# Patient Record
Sex: Female | Born: 1995 | Race: White | Hispanic: No | Marital: Single | Smoking: Former smoker
Health system: Southern US, Community
[De-identification: ages and names within clinical notes are randomized; demographics above are authoritative.]

## PROBLEM LIST (undated history)

## (undated) DIAGNOSIS — Z789 Other specified health status: Secondary | ICD-10-CM

## (undated) HISTORY — PX: OTHER SURGICAL HISTORY: SHX169

---
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
## 2019-03-25 ENCOUNTER — Encounter: Payer: Self-pay | Admitting: Obstetrics and Gynecology

## 2019-03-25 ENCOUNTER — Other Ambulatory Visit: Payer: Self-pay

## 2019-03-25 ENCOUNTER — Other Ambulatory Visit (HOSPITAL_COMMUNITY)
Admission: RE | Admit: 2019-03-25 | Discharge: 2019-03-25 | Disposition: A | Discharge status: Home / Self Care | Payer: 59 | Source: Ambulatory Visit | Attending: Obstetrics and Gynecology | Admitting: Obstetrics and Gynecology

## 2019-03-25 ENCOUNTER — Ambulatory Visit (INDEPENDENT_AMBULATORY_CARE_PROVIDER_SITE_OTHER): Payer: 59 | Admitting: Obstetrics and Gynecology

## 2019-03-25 VITALS — BP 104/73 | Wt 118.0 lb

## 2019-03-25 DIAGNOSIS — Z3201 Encounter for pregnancy test, result positive: Secondary | ICD-10-CM | POA: Diagnosis not present

## 2019-03-25 DIAGNOSIS — Z124 Encounter for screening for malignant neoplasm of cervix: Secondary | ICD-10-CM | POA: Diagnosis present

## 2019-03-25 DIAGNOSIS — Z3A01 Less than 8 weeks gestation of pregnancy: Secondary | ICD-10-CM

## 2019-03-25 DIAGNOSIS — Z113 Encounter for screening for infections with a predominantly sexual mode of transmission: Secondary | ICD-10-CM

## 2019-03-25 DIAGNOSIS — Z3400 Encounter for supervision of normal first pregnancy, unspecified trimester: Secondary | ICD-10-CM | POA: Insufficient documentation

## 2019-03-25 DIAGNOSIS — N912 Amenorrhea, unspecified: Secondary | ICD-10-CM

## 2019-03-25 DIAGNOSIS — Z3689 Encounter for other specified antenatal screening: Secondary | ICD-10-CM

## 2019-03-25 DIAGNOSIS — Z3401 Encounter for supervision of normal first pregnancy, first trimester: Secondary | ICD-10-CM

## 2019-03-25 LAB — POCT URINE PREGNANCY: Preg Test, Ur: POSITIVE — AB

## 2019-03-25 NOTE — Progress Notes (Signed)
NOB Discuss hernia and effects

## 2019-03-25 NOTE — Progress Notes (Signed)
New Obstetric Patient H&P    Chief Complaint: "Desires prenatal care"   History of Present Illness: Patient is a 23 y.o. G1P0 Not Hispanic or Latino female, presents with amenorrhea and positive home pregnancy test. Patient's last menstrual period was 02/11/2019 (exact date). and based on her  LMP, her EDD is Estimated Date of Delivery: 11/18/19 and her EGA is 7363w0d. Cycles are regular.  No prior paps.   Since her LMP she claims she has experienced nausea, fatigue. She denies vaginal bleeding. Her past medical history is noncontributory.   Since her LMP, she admits to the use of tobacco products  no There are cats in the home in the home  no  She admits close contact with children on a regular basis  no  She has had chicken pox in the past yes She has had Tuberculosis exposures, symptoms, or previously tested positive for TB   no Current or past history of domestic violence. no  Genetic Screening/Teratology Counseling: (Includes patient, baby's father, or anyone in either family with:)   1. [facility] 1435 [facility]  no 2. Thalassemia ([place], [place], Mediterranean, or Asian background): MCV<80  no 3. Neural tube defect (meningomyelocele, spina bifida, anencephaly)  no 4. Congenital heart defect  no  5. Down syndrome  no 6. Tay-Sachs (Jewish, [place])  no 7. Canavan's Disease  no 8. Sickle cell disease or trait (African)  no  9. Hemophilia or other blood disorders  no  10. Muscular dystrophy  no  11. Cystic fibrosis  no  12. Huntington's Chorea  no  13. Mental retardation/autism  no 14. Other inherited genetic or chromosomal disorder  no 15. Maternal metabolic disorder (DM, PKU, etc)  no 16. Patient or FOB with a child with a birth defect not listed above no  16a. Patient or FOB with a birth defect themselves no 17. Recurrent pregnancy loss, or stillbirth  no  18. Any medications since LMP other than prenatal vitamins (include vitamins, supplements, OTC meds, drugs,  alcohol)  no 19. Any other genetic/environmental exposure to discuss  no  Infection History:   1. Lives with someone with TB or TB exposed  no  2. Patient or partner has history of genital herpes  no 3. Rash or viral illness since LMP  no 4. History of STI (GC, CT, HPV, syphilis, HIV)  no 5. History of recent travel :  no  Other pertinent information:  no     Review of Systems:10 point review of systems negative unless otherwise noted in HPI  Past Medical History:  No past medical history on file.  Past Surgical History:  No past surgical history on file.  Gynecologic History: Patient's last menstrual period was 02/11/2019 (exact date).  Obstetric History: G1P0  Family History:  No family history on file.  Social History:  Social History   Socioeconomic History  . Marital status: Single    Spouse name: Not on file  . Number of children: Not on file  . Years of education: Not on file  . Highest education level: Not on file  Occupational History  . Not on file  Social Needs  . Financial resource strain: Not on file  . Food insecurity    Worry: Not on file    Inability: Not on file  . Transportation needs    Medical: Not on file    Non-medical: Not on file  Tobacco Use  . Smoking status: Former Games developermoker  . Smokeless tobacco: Never  Used  Substance and Sexual Activity  . Alcohol use: Yes  . Drug use: No  . Sexual activity: Not on file  Lifestyle  . Physical activity    Days per week: Not on file    Minutes per session: Not on file  . Stress: Not on file  Relationships  . Social Herbalist on phone: Not on file    Gets together: Not on file    Attends religious service: Not on file    Active member of club or organization: Not on file    Attends meetings of clubs or organizations: Not on file    Relationship status: Not on file  . Intimate partner violence    Fear of current or ex partner: Not on file    Emotionally abused: Not on file     Physically abused: Not on file    Forced sexual activity: Not on file  Other Topics Concern  . Not on file  Social History Narrative  . Not on file    Allergies:  No Known Allergies  Medications: Prior to Admission medications   Not on File    Physical Exam Vitals: Weight 118 lb (53.5 kg), last menstrual period 02/11/2019.  General: NAD HEENT: normocephalic, anicteric Thyroid: no enlargement, no palpable nodules Pulmonary: No increased work of breathing, CTAB Cardiovascular: RRR, distal pulses 2+ Abdomen: NABS, soft, non-tender, non-distended.  Umbilicus without lesions.  No hepatomegaly, splenomegaly or masses palpable. No evidence of hernia  Genitourinary:  External: Normal external female genitalia.  Normal urethral meatus, normal  Bartholin's and Skene's glands.    Vagina: Normal vaginal mucosa, no evidence of prolapse.    Cervix: Grossly normal in appearance, no bleeding  Uterus:  Non-enlarged, mobile, normal contour.  No CMT  Adnexa: ovaries non-enlarged, no adnexal masses  Rectal: deferred Extremities: no edema, erythema, or tenderness Neurologic: Grossly intact Psychiatric: mood appropriate, affect full   Assessment: 23 y.o. G1P0 at [redacted]w[redacted]d presenting to initiate prenatal care  Plan: 1) Avoid alcoholic beverages. 2) Patient encouraged not to smoke.  3) Discontinue the use of all non-medicinal drugs and chemicals.  4) Take prenatal vitamins daily.  5) Nutrition, food safety (fish, cheese advisories, and high nitrite foods) and exercise discussed. 6) Hospital and practice style discussed with cross coverage system.  7) Genetic Screening, such as with 1st Trimester Screening, cell free fetal DNA, AFP testing, and Ultrasound, as well as with amniocentesis and CVS as appropriate, is discussed with patient. At the conclusion of today's visit patient declined genetic testing   Malachy Mood, MD, [place], [place] 03/25/2019, 11:32 AM

## 2019-03-27 LAB — URINE CULTURE: Organism ID, Bacteria: NO GROWTH

## 2019-03-30 LAB — CYTOLOGY - PAP
Chlamydia: NEGATIVE
Neisseria Gonorrhea: NEGATIVE

## 2019-04-07 ENCOUNTER — Other Ambulatory Visit: Payer: 59

## 2019-04-07 ENCOUNTER — Encounter: Payer: 59 | Admitting: Obstetrics and Gynecology

## 2019-04-10 ENCOUNTER — Ambulatory Visit (INDEPENDENT_AMBULATORY_CARE_PROVIDER_SITE_OTHER): Payer: 59 | Admitting: Maternal Newborn

## 2019-04-10 ENCOUNTER — Encounter: Payer: Self-pay | Admitting: Maternal Newborn

## 2019-04-10 ENCOUNTER — Ambulatory Visit (INDEPENDENT_AMBULATORY_CARE_PROVIDER_SITE_OTHER): Payer: 59

## 2019-04-10 ENCOUNTER — Other Ambulatory Visit: Payer: Self-pay | Admitting: Obstetrics and Gynecology

## 2019-04-10 ENCOUNTER — Other Ambulatory Visit: Payer: Self-pay

## 2019-04-10 VITALS — BP 108/70 | Wt 116.2 lb

## 2019-04-10 DIAGNOSIS — N8312 Corpus luteum cyst of left ovary: Secondary | ICD-10-CM

## 2019-04-10 DIAGNOSIS — Z3401 Encounter for supervision of normal first pregnancy, first trimester: Secondary | ICD-10-CM

## 2019-04-10 DIAGNOSIS — Z3689 Encounter for other specified antenatal screening: Secondary | ICD-10-CM

## 2019-04-10 DIAGNOSIS — Z3A08 8 weeks gestation of pregnancy: Secondary | ICD-10-CM

## 2019-04-10 DIAGNOSIS — O3481 Maternal care for other abnormalities of pelvic organs, first trimester: Secondary | ICD-10-CM

## 2019-04-10 DIAGNOSIS — Z3400 Encounter for supervision of normal first pregnancy, unspecified trimester: Secondary | ICD-10-CM

## 2019-04-10 NOTE — Patient Instructions (Signed)
First Trimester of Pregnancy The first trimester of pregnancy is from week 1 until the end of week 13 (months 1 through 3). A week after a sperm fertilizes an egg, the egg will implant on the wall of the uterus. This embryo will begin to develop into a baby. Genes from you and your partner will form the baby. The female genes will determine whether the baby will be a boy or a girl. At 6-8 weeks, the eyes and face will be formed, and the heartbeat can be seen on ultrasound. At the end of 12 weeks, all the baby's organs will be formed. Now that you are pregnant, you will want to do everything you can to have a healthy baby. Two of the most important things are to get good prenatal care and to follow your health care provider's instructions. Prenatal care is all the medical care you receive before the baby's birth. This care will help prevent, find, and treat any problems during the pregnancy and childbirth. Body changes during your first trimester Your body goes through many changes during pregnancy. The changes vary from woman to woman.  You may gain or lose a couple of pounds at first.  You may feel sick to your stomach (nauseous) and you may throw up (vomit). If the vomiting is uncontrollable, call your health care provider.  You may tire easily.  You may develop headaches that can be relieved by medicines. All medicines should be approved by your health care provider.  You may urinate more often. Painful urination may mean you have a bladder infection.  You may develop heartburn as a result of your pregnancy.  You may develop constipation because certain hormones are causing the muscles that push stool through your intestines to slow down.  You may develop hemorrhoids or swollen veins (varicose veins).  Your breasts may begin to grow larger and become tender. Your nipples may stick out more, and the tissue that surrounds them (areola) may become darker.  Your gums may bleed and may be  sensitive to brushing and flossing.  Dark spots or blotches (chloasma, mask of pregnancy) may develop on your face. This will likely fade after the baby is born.  Your menstrual periods will stop.  You may have a loss of appetite.  You may develop cravings for certain kinds of food.  You may have changes in your emotions from day to day, such as being excited to be pregnant or being concerned that something may go wrong with the pregnancy and baby.  You may have more vivid and strange dreams.  You may have changes in your hair. These can include thickening of your hair, rapid growth, and changes in texture. Some women also have hair loss during or after pregnancy, or hair that feels dry or thin. Your hair will most likely return to normal after your baby is born. What to expect at prenatal visits During a routine prenatal visit:  You will be weighed to make sure you and the baby are growing normally.  Your blood pressure will be taken.  Your abdomen will be measured to track your baby's growth.  The fetal heartbeat will be listened to between weeks 10 and 14 of your pregnancy.  Test results from any previous visits will be discussed. Your health care provider may ask you:  How you are feeling.  If you are feeling the baby move.  If you have had any abnormal symptoms, such as leaking fluid, bleeding, severe headaches, or abdominal   cramping.  If you are using any tobacco products, including cigarettes, chewing tobacco, and electronic cigarettes.  If you have any questions. Other tests that may be performed during your first trimester include:  Blood tests to find your blood type and to check for the presence of any previous infections. The tests will also be used to check for low iron levels (anemia) and protein on red blood cells (Rh antibodies). Depending on your risk factors, or if you previously had diabetes during pregnancy, you may have tests to check for high blood sugar  that affects pregnant women (gestational diabetes).  Urine tests to check for infections, diabetes, or protein in the urine.  An ultrasound to confirm the proper growth and development of the baby.  Fetal screens for spinal cord problems (spina bifida) and Down syndrome.  HIV (human immunodeficiency virus) testing. Routine prenatal testing includes screening for HIV, unless you choose not to have this test.  You may need other tests to make sure you and the baby are doing well. Follow these instructions at home: Medicines  Follow your health care provider's instructions regarding medicine use. Specific medicines may be either safe or unsafe to take during pregnancy.  Take a prenatal vitamin that contains at least 600 micrograms (mcg) of folic acid.  If you develop constipation, try taking a stool softener if your health care provider approves. Eating and drinking   Eat a balanced diet that includes fresh fruits and vegetables, whole grains, good sources of protein such as meat, eggs, or tofu, and low-fat dairy. Your health care provider will help you determine the amount of weight gain that is right for you.  Avoid raw meat and uncooked cheese. These carry germs that can cause birth defects in the baby.  Eating four or five small meals rather than three large meals a day may help relieve nausea and vomiting. If you start to feel nauseous, eating a few soda crackers can be helpful. Drinking liquids between meals, instead of during meals, also seems to help ease nausea and vomiting.  Limit foods that are high in fat and processed sugars, such as fried and sweet foods.  To prevent constipation: ? Eat foods that are high in fiber, such as fresh fruits and vegetables, whole grains, and beans. ? Drink enough fluid to keep your urine clear or pale yellow. Activity  Exercise only as directed by your health care provider. Most women can continue their usual exercise routine during  pregnancy. Try to exercise for 30 minutes at least 5 days a week. Exercising will help you: ? Control your weight. ? Stay in shape. ? Be prepared for labor and delivery.  Experiencing pain or cramping in the lower abdomen or lower back is a good sign that you should stop exercising. Check with your health care provider before continuing with normal exercises.  Try to avoid standing for long periods of time. Move your legs often if you must stand in one place for a long time.  Avoid heavy lifting.  Wear low-heeled shoes and practice good posture.  You may continue to have sex unless your health care provider tells you not to. Relieving pain and discomfort  Wear a good support bra to relieve breast tenderness.  Take warm sitz baths to soothe any pain or discomfort caused by hemorrhoids. Use hemorrhoid cream if your health care provider approves.  Rest with your legs elevated if you have leg cramps or low back pain.  If you develop varicose veins in   your legs, wear support hose. Elevate your feet for 15 minutes, 3-4 times a day. Limit salt in your diet. Prenatal care  Schedule your prenatal visits by the twelfth week of pregnancy. They are usually scheduled monthly at first, then more often in the last 2 months before delivery.  Write down your questions. Take them to your prenatal visits.  Keep all your prenatal visits as told by your health care provider. This is important. Safety  Wear your seat belt at all times when driving.  Make a list of emergency phone numbers, including numbers for family, friends, the hospital, and police and fire departments. General instructions  Ask your health care provider for a referral to a local prenatal education class. Begin classes no later than the beginning of month 6 of your pregnancy.  Ask for help if you have counseling or nutritional needs during pregnancy. Your health care provider can offer advice or refer you to specialists for help  with various needs.  Do not use hot tubs, steam rooms, or saunas.  Do not douche or use tampons or scented sanitary pads.  Do not cross your legs for long periods of time.  Avoid cat litter boxes and soil used by cats. These carry germs that can cause birth defects in the baby and possibly loss of the fetus by miscarriage or stillbirth.  Avoid all smoking, herbs, alcohol, and medicines not prescribed by your health care provider. Chemicals in these products affect the formation and growth of the baby.  Do not use any products that contain nicotine or tobacco, such as cigarettes and e-cigarettes. If you need help quitting, ask your health care provider. You may receive counseling support and other resources to help you quit.  Schedule a dentist appointment. At home, brush your teeth with a soft toothbrush and be gentle when you floss. Contact a health care provider if:  You have dizziness.  You have mild pelvic cramps, pelvic pressure, or nagging pain in the abdominal area.  You have persistent nausea, vomiting, or diarrhea.  You have a bad smelling vaginal discharge.  You have pain when you urinate.  You notice increased swelling in your face, hands, legs, or ankles.  You are exposed to fifth disease or chickenpox.  You are exposed to German measles (rubella) and have never had it. Get help right away if:  You have a fever.  You are leaking fluid from your vagina.  You have spotting or bleeding from your vagina.  You have severe abdominal cramping or pain.  You have rapid weight gain or loss.  You vomit blood or material that looks like coffee grounds.  You develop a severe headache.  You have shortness of breath.  You have any kind of trauma, such as from a fall or a car accident. Summary  The first trimester of pregnancy is from week 1 until the end of week 13 (months 1 through 3).  Your body goes through many changes during pregnancy. The changes vary from  woman to woman.  You will have routine prenatal visits. During those visits, your health care provider will examine you, discuss any test results you may have, and talk with you about how you are feeling. This information is not intended to replace advice given to you by your health care provider. Make sure you discuss any questions you have with your health care provider. Document Released: 08/07/2001 Document Revised: 07/26/2017 Document Reviewed: 07/25/2016 Elsevier Patient Education  2020 Elsevier Inc.  

## 2019-04-10 NOTE — Progress Notes (Signed)
    Routine Prenatal Care Visit  Subjective  Sonya Shannon is a 23 y.o. G1P0000 at [redacted]w[redacted]d being seen today for ongoing prenatal care.  She is currently monitored for the following issues for this low-risk pregnancy and has Epigastric hernia and Supervision of normal first pregnancy, antepartum on their problem list.  ----------------------------------------------------------------------------------- Patient reports no complaints.   Vag. Bleeding: None.  ----------------------------------------------------------------------------------- The following portions of the patient's history were reviewed and updated as appropriate: allergies, current medications, past family history, past medical history, past social history, past surgical history and problem list. Problem list updated.   Objective  Blood pressure 108/70, weight 116 lb 3.2 oz (52.7 kg), last menstrual period 02/11/2019. Pregravid weight 116 lb (52.6 kg) Total Weight Gain 3.2 oz (0.091 kg)  Fetal Status: Fetal Heart Rate (bpm): 175 ([place])         General:  Alert, oriented and cooperative. Patient is in no acute distress.  Skin: Skin is warm and dry. No rash noted.   Cardiovascular: Normal heart rate noted  Respiratory: Normal respiratory effort, no problems with respiration noted  Abdomen: Soft, gravid, appropriate for gestational age. Pain/Pressure: Absent     Pelvic:  Cervical exam deferred        Extremities: Normal range of motion.     Mental Status: Normal mood and affect. Normal behavior. Normal judgment and thought content.     Assessment   23 y.o. G1P0000 at [redacted]w[redacted]d, EDD 11/18/2019 by Last Menstrual Period presenting for a routine prenatal visit.  Plan   Pregnancy#1 Problems (from 02/11/19 to present)    No problems associated with this episode.      Dating scan today showed a singleton IUP at 8w 4d, consistent with LMP dating. Results reviewed with patient.  NOB labs today.  Please refer to After Visit Summary  for other counseling recommendations.   Return in about 4 weeks (around 05/08/2019) for [facility] telephone.  Avel Sensor, CNM 04/10/2019  8:38 AM

## 2019-04-10 NOTE — Progress Notes (Signed)
ROB and Dating scan- no concerns

## 2019-04-11 LAB — RPR+RH+ABO+RUB AB+AB SCR+CB...
Antibody Screen: NEGATIVE
HIV Screen 4th Generation wRfx: NONREACTIVE
Hematocrit: 35.8 % (ref 34.0–46.6)
Hemoglobin: 12.3 g/dL (ref 11.1–15.9)
Hepatitis B Surface Ag: NEGATIVE
MCH: 31.1 pg (ref 26.6–33.0)
MCHC: 34.4 g/dL (ref 31.5–35.7)
MCV: 91 fL (ref 79–97)
Platelets: 178 10*3/uL (ref 150–450)
RBC: 3.95 x10E6/uL (ref 3.77–5.28)
RDW: 12.1 % (ref 11.7–15.4)
RPR Ser Ql: NONREACTIVE
Rh Factor: POSITIVE
Rubella Antibodies, IGG: 1.42 index (ref >0.99)
Varicella zoster IgG: 947 index (ref >165)
WBC: 7 10*3/uL (ref 3.4–10.8)

## 2019-05-08 ENCOUNTER — Ambulatory Visit (INDEPENDENT_AMBULATORY_CARE_PROVIDER_SITE_OTHER): Payer: 59 | Admitting: Maternal Newborn

## 2019-05-08 ENCOUNTER — Other Ambulatory Visit: Payer: Self-pay

## 2019-05-08 ENCOUNTER — Encounter: Payer: Self-pay | Admitting: Maternal Newborn

## 2019-05-08 DIAGNOSIS — Z3A12 12 weeks gestation of pregnancy: Secondary | ICD-10-CM

## 2019-05-08 DIAGNOSIS — Z3401 Encounter for supervision of normal first pregnancy, first trimester: Secondary | ICD-10-CM

## 2019-05-08 DIAGNOSIS — Z3400 Encounter for supervision of normal first pregnancy, unspecified trimester: Secondary | ICD-10-CM

## 2019-05-08 NOTE — Patient Instructions (Signed)
First Trimester of Pregnancy The first trimester of pregnancy is from week 1 until the end of week 13 (months 1 through 3). A week after a sperm fertilizes an egg, the egg will implant on the wall of the uterus. This embryo will begin to develop into a baby. Genes from you and your partner will form the baby. The female genes will determine whether the baby will be a boy or a girl. At 6-8 weeks, the eyes and face will be formed, and the heartbeat can be seen on ultrasound. At the end of 12 weeks, all the baby's organs will be formed. Now that you are pregnant, you will want to do everything you can to have a healthy baby. Two of the most important things are to get good prenatal care and to follow your health care provider's instructions. Prenatal care is all the medical care you receive before the baby's birth. This care will help prevent, find, and treat any problems during the pregnancy and childbirth. Body changes during your first trimester Your body goes through many changes during pregnancy. The changes vary from woman to woman.  You may gain or lose a couple of pounds at first.  You may feel sick to your stomach (nauseous) and you may throw up (vomit). If the vomiting is uncontrollable, call your health care provider.  You may tire easily.  You may develop headaches that can be relieved by medicines. All medicines should be approved by your health care provider.  You may urinate more often. Painful urination may mean you have a bladder infection.  You may develop heartburn as a result of your pregnancy.  You may develop constipation because certain hormones are causing the muscles that push stool through your intestines to slow down.  You may develop hemorrhoids or swollen veins (varicose veins).  Your breasts may begin to grow larger and become tender. Your nipples may stick out more, and the tissue that surrounds them (areola) may become darker.  Your gums may bleed and may be  sensitive to brushing and flossing.  Dark spots or blotches (chloasma, mask of pregnancy) may develop on your face. This will likely fade after the baby is born.  Your menstrual periods will stop.  You may have a loss of appetite.  You may develop cravings for certain kinds of food.  You may have changes in your emotions from day to day, such as being excited to be pregnant or being concerned that something may go wrong with the pregnancy and baby.  You may have more vivid and strange dreams.  You may have changes in your hair. These can include thickening of your hair, rapid growth, and changes in texture. Some women also have hair loss during or after pregnancy, or hair that feels dry or thin. Your hair will most likely return to normal after your baby is born. What to expect at prenatal visits During a routine prenatal visit:  You will be weighed to make sure you and the baby are growing normally.  Your blood pressure will be taken.  Your abdomen will be measured to track your baby's growth.  The fetal heartbeat will be listened to between weeks 10 and 14 of your pregnancy.  Test results from any previous visits will be discussed. Your health care provider may ask you:  How you are feeling.  If you are feeling the baby move.  If you have had any abnormal symptoms, such as leaking fluid, bleeding, severe headaches, or abdominal   cramping.  If you are using any tobacco products, including cigarettes, chewing tobacco, and electronic cigarettes.  If you have any questions. Other tests that may be performed during your first trimester include:  Blood tests to find your blood type and to check for the presence of any previous infections. The tests will also be used to check for low iron levels (anemia) and protein on red blood cells (Rh antibodies). Depending on your risk factors, or if you previously had diabetes during pregnancy, you may have tests to check for high blood sugar  that affects pregnant women (gestational diabetes).  Urine tests to check for infections, diabetes, or protein in the urine.  An ultrasound to confirm the proper growth and development of the baby.  Fetal screens for spinal cord problems (spina bifida) and Down syndrome.  HIV (human immunodeficiency virus) testing. Routine prenatal testing includes screening for HIV, unless you choose not to have this test.  You may need other tests to make sure you and the baby are doing well. Follow these instructions at home: Medicines  Follow your health care provider's instructions regarding medicine use. Specific medicines may be either safe or unsafe to take during pregnancy.  Take a prenatal vitamin that contains at least 600 micrograms (mcg) of folic acid.  If you develop constipation, try taking a stool softener if your health care provider approves. Eating and drinking   Eat a balanced diet that includes fresh fruits and vegetables, whole grains, good sources of protein such as meat, eggs, or tofu, and low-fat dairy. Your health care provider will help you determine the amount of weight gain that is right for you.  Avoid raw meat and uncooked cheese. These carry germs that can cause birth defects in the baby.  Eating four or five small meals rather than three large meals a day may help relieve nausea and vomiting. If you start to feel nauseous, eating a few soda crackers can be helpful. Drinking liquids between meals, instead of during meals, also seems to help ease nausea and vomiting.  Limit foods that are high in fat and processed sugars, such as fried and sweet foods.  To prevent constipation: ? Eat foods that are high in fiber, such as fresh fruits and vegetables, whole grains, and beans. ? Drink enough fluid to keep your urine clear or pale yellow. Activity  Exercise only as directed by your health care provider. Most women can continue their usual exercise routine during  pregnancy. Try to exercise for 30 minutes at least 5 days a week. Exercising will help you: ? Control your weight. ? Stay in shape. ? Be prepared for labor and delivery.  Experiencing pain or cramping in the lower abdomen or lower back is a good sign that you should stop exercising. Check with your health care provider before continuing with normal exercises.  Try to avoid standing for long periods of time. Move your legs often if you must stand in one place for a long time.  Avoid heavy lifting.  Wear low-heeled shoes and practice good posture.  You may continue to have sex unless your health care provider tells you not to. Relieving pain and discomfort  Wear a good support bra to relieve breast tenderness.  Take warm sitz baths to soothe any pain or discomfort caused by hemorrhoids. Use hemorrhoid cream if your health care provider approves.  Rest with your legs elevated if you have leg cramps or low back pain.  If you develop varicose veins in   your legs, wear support hose. Elevate your feet for 15 minutes, 3-4 times a day. Limit salt in your diet. Prenatal care  Schedule your prenatal visits by the twelfth week of pregnancy. They are usually scheduled monthly at first, then more often in the last 2 months before delivery.  Write down your questions. Take them to your prenatal visits.  Keep all your prenatal visits as told by your health care provider. This is important. Safety  Wear your seat belt at all times when driving.  Make a list of emergency phone numbers, including numbers for family, friends, the hospital, and police and fire departments. General instructions  Ask your health care provider for a referral to a local prenatal education class. Begin classes no later than the beginning of month 6 of your pregnancy.  Ask for help if you have counseling or nutritional needs during pregnancy. Your health care provider can offer advice or refer you to specialists for help  with various needs.  Do not use hot tubs, steam rooms, or saunas.  Do not douche or use tampons or scented sanitary pads.  Do not cross your legs for long periods of time.  Avoid cat litter boxes and soil used by cats. These carry germs that can cause birth defects in the baby and possibly loss of the fetus by miscarriage or stillbirth.  Avoid all smoking, herbs, alcohol, and medicines not prescribed by your health care provider. Chemicals in these products affect the formation and growth of the baby.  Do not use any products that contain nicotine or tobacco, such as cigarettes and e-cigarettes. If you need help quitting, ask your health care provider. You may receive counseling support and other resources to help you quit.  Schedule a dentist appointment. At home, brush your teeth with a soft toothbrush and be gentle when you floss. Contact a health care provider if:  You have dizziness.  You have mild pelvic cramps, pelvic pressure, or nagging pain in the abdominal area.  You have persistent nausea, vomiting, or diarrhea.  You have a bad smelling vaginal discharge.  You have pain when you urinate.  You notice increased swelling in your face, hands, legs, or ankles.  You are exposed to fifth disease or chickenpox.  You are exposed to German measles (rubella) and have never had it. Get help right away if:  You have a fever.  You are leaking fluid from your vagina.  You have spotting or bleeding from your vagina.  You have severe abdominal cramping or pain.  You have rapid weight gain or loss.  You vomit blood or material that looks like coffee grounds.  You develop a severe headache.  You have shortness of breath.  You have any kind of trauma, such as from a fall or a car accident. Summary  The first trimester of pregnancy is from week 1 until the end of week 13 (months 1 through 3).  Your body goes through many changes during pregnancy. The changes vary from  woman to woman.  You will have routine prenatal visits. During those visits, your health care provider will examine you, discuss any test results you may have, and talk with you about how you are feeling. This information is not intended to replace advice given to you by your health care provider. Make sure you discuss any questions you have with your health care provider. Document Released: 08/07/2001 Document Revised: 07/26/2017 Document Reviewed: 07/25/2016 Elsevier Patient Education  2020 Elsevier Inc.  

## 2019-05-08 NOTE — Progress Notes (Signed)
Virtual Visit via Telephone  I connected with Sonya Shannon on 05/08/19 at  9:40 AM EDT by telephone and verified that I am speaking with the correct person using two identifiers.  Location: Patient: Home Provider: Office   The patient agreed to proceed with a televisit today.  The note below documents the prenatal telephone visit:     Subjective  Sonya Shannon is a 23 y.o. G1P0000 at [redacted]w[redacted]d being seen today for ongoing prenatal care.  She is currently monitored for the following issues for this low-risk pregnancy and has Epigastric hernia and Supervision of normal first pregnancy, antepartum on their problem list.  ----------------------------------------------------------------------------------- Patient reports occasional nausea.  She is having mild dizziness when she first gets up in the morning. Vag. Bleeding: None.  No leaking of fluid.  ----------------------------------------------------------------------------------- The following portions of the patient's history were reviewed and updated as appropriate: allergies, current medications, past family history, past medical history, past social history, past surgical history and problem list. Problem list updated.   Objective  Last menstrual period 02/11/2019. Pregravid weight 116 lb (52.6 kg) Total Weight Gain 3.2 oz (0.091 kg)  Physical exam not done as this was a telephone visit completed during COVID-19 precautions.   Assessment   23 y.o. G1P0000 at [redacted]w[redacted]d EDD 11/18/2019, by Last Menstrual Period presenting for a prenatal telephone visit.  Plan   Pregnancy#1 Problems (from 02/11/19 to present)    No problems associated with this episode.      In person visit next time for Doppler FHT. Discussed anatomy scan at 20 weeks. Advised of LSIL Pap results and plan for repeat Pap in one year.  I discussed the assessment and treatment plan with the patient. The patient was provided an opportunity to ask questions and  all were answered. The patient agreed with the plan and demonstrated an understanding of the instructions.   The patient was advised to call back or seek an in-person evaluation as needed.  I provided 4 minutes of non-face-to-face time during this encounter.  Please refer to After Visit Summary for other counseling recommendations.   Return in about 4 weeks (around 06/05/2019) for ROB.  Avel Sensor, CNM 05/08/2019  9:51 AM

## 2019-05-08 NOTE — Progress Notes (Signed)
[place] telephone visit- no concerns

## 2019-06-05 ENCOUNTER — Encounter: Payer: Self-pay | Admitting: Maternal Newborn

## 2019-06-05 ENCOUNTER — Ambulatory Visit (INDEPENDENT_AMBULATORY_CARE_PROVIDER_SITE_OTHER): Payer: 59 | Admitting: Maternal Newborn

## 2019-06-05 ENCOUNTER — Other Ambulatory Visit: Payer: Self-pay

## 2019-06-05 VITALS — BP 90/60 | Wt 126.0 lb

## 2019-06-05 DIAGNOSIS — Z3689 Encounter for other specified antenatal screening: Secondary | ICD-10-CM

## 2019-06-05 DIAGNOSIS — Z3A16 16 weeks gestation of pregnancy: Secondary | ICD-10-CM

## 2019-06-05 DIAGNOSIS — Z3402 Encounter for supervision of normal first pregnancy, second trimester: Secondary | ICD-10-CM

## 2019-06-05 DIAGNOSIS — Z3400 Encounter for supervision of normal first pregnancy, unspecified trimester: Secondary | ICD-10-CM

## 2019-06-05 LAB — POCT URINALYSIS DIPSTICK OB
Glucose, UA: NEGATIVE
POC,PROTEIN,UA: NEGATIVE

## 2019-06-05 NOTE — Progress Notes (Signed)
ROB- no concerns/denies flu shot

## 2019-06-05 NOTE — Progress Notes (Signed)
    Routine Prenatal Care Visit  Subjective  Sonya Shannon is a 23 y.o. G1P0000 at [redacted]w[redacted]d being seen today for ongoing prenatal care.  She is currently monitored for the following issues for this low-risk pregnancy and has Epigastric hernia and Supervision of normal first pregnancy, antepartum on their problem list.  ----------------------------------------------------------------------------------- Patient reports no complaints.   Vag. Bleeding: None. No leaking of fluid.  ----------------------------------------------------------------------------------- The following portions of the patient's history were reviewed and updated as appropriate: allergies, current medications, past family history, past medical history, past social history, past surgical history and problem list. Problem list updated.   Objective  Blood pressure 90/60, weight 126 lb (57.2 kg), last menstrual period 02/11/2019. Pregravid weight 116 lb (52.6 kg) Total Weight Gain 10 lb (4.536 kg) Urinalysis: Urine dipstick shows negative for glucose, protein.  Fetal Status: Fetal Heart Rate (bpm): 155         General:  Alert, oriented and cooperative. Patient is in no acute distress.  Skin: Skin is warm and dry. No rash noted.   Cardiovascular: Normal heart rate noted  Respiratory: Normal respiratory effort, no problems with respiration noted  Abdomen: Soft, gravid, appropriate for gestational age. Pain/Pressure: Present     Pelvic:  Cervical exam deferred        Extremities: Normal range of motion.  Edema: None  Mental Status: Normal mood and affect. Normal behavior. Normal judgment and thought content.     Assessment   23 y.o. G1P0000 at [redacted]w[redacted]d, EDD 11/18/2019 by Last Menstrual Period presenting for a routine prenatal visit.  Plan   Pregnancy#1 Problems (from 02/11/19 to present)    No problems associated with this episode.      Discussed anatomy scan for next time.  Please refer to After Visit Summary for  other counseling recommendations.   Return in about 4 weeks (around 07/03/2019) for ROB and anatomy scan.  Avel Sensor, CNM 06/05/2019  9:06 AM

## 2019-06-05 NOTE — Patient Instructions (Signed)

## 2019-07-03 ENCOUNTER — Ambulatory Visit: Payer: 59

## 2019-07-03 ENCOUNTER — Other Ambulatory Visit: Payer: Self-pay

## 2019-07-03 ENCOUNTER — Ambulatory Visit (INDEPENDENT_AMBULATORY_CARE_PROVIDER_SITE_OTHER): Payer: 59 | Admitting: Obstetrics and Gynecology

## 2019-07-03 ENCOUNTER — Encounter: Payer: Self-pay | Admitting: Obstetrics and Gynecology

## 2019-07-03 VITALS — BP 98/50 | Wt 133.0 lb

## 2019-07-03 DIAGNOSIS — Z3A20 20 weeks gestation of pregnancy: Secondary | ICD-10-CM

## 2019-07-03 DIAGNOSIS — Z3400 Encounter for supervision of normal first pregnancy, unspecified trimester: Secondary | ICD-10-CM

## 2019-07-03 DIAGNOSIS — Z3402 Encounter for supervision of normal first pregnancy, second trimester: Secondary | ICD-10-CM

## 2019-07-03 LAB — POCT URINALYSIS DIPSTICK OB: Glucose, UA: NEGATIVE

## 2019-07-03 NOTE — Progress Notes (Signed)
ROB No concerns Denies lof, no vb Good FM Declines flu

## 2019-07-03 NOTE — Progress Notes (Signed)
Routine Prenatal Care Visit  Subjective  Sonya Shannon is a 23 y.o. G1P0000 at [redacted]w[redacted]d being seen today for ongoing prenatal care.  She is currently monitored for the following issues for this low-risk pregnancy and has Epigastric hernia and Supervision of normal first pregnancy, antepartum on their problem list.  ----------------------------------------------------------------------------------- Patient reports no complaints.   Contractions: Not present. Vag. Bleeding: None.  Movement: Present. Denies leaking of fluid.  ----------------------------------------------------------------------------------- The following portions of the patient's history were reviewed and updated as appropriate: allergies, current medications, past family history, past medical history, past social history, past surgical history and problem list. Problem list updated.   Objective  Blood pressure (!) 98/50, weight 133 lb (60.3 kg), last menstrual period 02/11/2019. Pregravid weight 116 lb (52.6 kg) Total Weight Gain 17 lb (7.711 kg) Urinalysis:      Fetal Status: Fetal Heart Rate (bpm): 150 Fundal Height: 20 cm Movement: Present     General:  Alert, oriented and cooperative. Patient is in no acute distress.  Skin: Skin is warm and dry. No rash noted.   Cardiovascular: Normal heart rate noted  Respiratory: Normal respiratory effort, no problems with respiration noted  Abdomen: Soft, gravid, appropriate for gestational age. Pain/Pressure: Absent     Pelvic:  Cervical exam deferred        Extremities: Normal range of motion.  Edema: None  Mental Status: Normal mood and affect. Normal behavior. Normal judgment and thought content.     Assessment   23 y.o. G1P0000 at [redacted]w[redacted]d by  11/18/2019, by Last Menstrual Period presenting for routine prenatal visit  Plan   Pregnancy#1 Problems (from 02/11/19 to present)    Problem Noted Resolved   Supervision of normal first pregnancy, antepartum 03/25/2019 by  Malachy Mood, MD No   Overview Addendum 07/03/2019  9:02 AM by Homero Fellers, [place]  Dating LMP Blood type: A/Positive/-- (08/14 0846)   Genetic Screen Declines genetic testing Antibody:Negative (08/14 0846)  Anatomic [place] Patient desires to have this done at [facility], can not afford here.  Rubella: 1.42 (08/14 0846) Varicella: Immune  GTT Third trimester:  RPR: Non Reactive (08/14 0846)   Rhogam  HBsAg: Negative (08/14 0846)   TDaP vaccine                        Flu Shot: Declined HIV: Non Reactive (08/14 0846)   Baby Food Breast- shown [email or web address]        Given Book   GBS:   Contraception  Pap:03/25/2019 LGSIL  CBB     CS/VBAC    Support Person                Gestational age appropriate obstetric precautions including but not limited to vaginal bleeding, contractions, leaking of fluid and fetal movement were reviewed in detail with the patient.    Patient declining anatomy US today- she says she can not afford it and that she wants to have it done at [facility]. I told her that I do not believe that this facility performs anatomy ultrasounds, but that we would need a copy of the ultrasound report if I am mistaken. Discussed importance of anatomy US.  Discussed with Izora Gala. Patient child on a policy, likely needs pregnancy medicaid. Izora Gala will reach out to patient to discuss further.  Discussed breast feeding.   Return in about 4 weeks (around 07/31/2019) for ROB in person.  Merrin Mcvicker R  Jerene Pitch MD [facility], [facility] 07/03/2019, 9:03 AM

## 2019-07-31 ENCOUNTER — Ambulatory Visit (INDEPENDENT_AMBULATORY_CARE_PROVIDER_SITE_OTHER): Payer: 59 | Admitting: Obstetrics and Gynecology

## 2019-07-31 ENCOUNTER — Encounter: Payer: Self-pay | Admitting: Obstetrics and Gynecology

## 2019-07-31 ENCOUNTER — Other Ambulatory Visit: Payer: Self-pay

## 2019-07-31 VITALS — BP 104/64 | Wt 140.0 lb

## 2019-07-31 DIAGNOSIS — Z131 Encounter for screening for diabetes mellitus: Secondary | ICD-10-CM

## 2019-07-31 DIAGNOSIS — Z3A24 24 weeks gestation of pregnancy: Secondary | ICD-10-CM

## 2019-07-31 DIAGNOSIS — Z3402 Encounter for supervision of normal first pregnancy, second trimester: Secondary | ICD-10-CM

## 2019-07-31 DIAGNOSIS — Z3400 Encounter for supervision of normal first pregnancy, unspecified trimester: Secondary | ICD-10-CM

## 2019-07-31 DIAGNOSIS — Z113 Encounter for screening for infections with a predominantly sexual mode of transmission: Secondary | ICD-10-CM

## 2019-07-31 NOTE — Progress Notes (Signed)
  Routine Prenatal Care Visit  Subjective  Sonya Shannon is a 23 y.o. G1P0000 at [redacted]w[redacted]d being seen today for ongoing prenatal care.  She is currently monitored for the following issues for this low-risk pregnancy and has Epigastric hernia and Supervision of normal first pregnancy, antepartum on their problem list.  ----------------------------------------------------------------------------------- Patient reports no complaints.   Contractions: Not present. Vag. Bleeding: None.  Movement: Present. Leaking Fluid denies.  ----------------------------------------------------------------------------------- The following portions of the patient's history were reviewed and updated as appropriate: allergies, current medications, past family history, past medical history, past social history, past surgical history and problem list. Problem list updated.  Objective  Blood pressure 104/64, weight 140 lb (63.5 kg), last menstrual period 02/11/2019. Pregravid weight 116 lb (52.6 kg) Total Weight Gain 24 lb (10.9 kg) Urinalysis: Urine Protein    Urine Glucose    Fetal Status: Fetal Heart Rate (bpm): 145 Fundal Height: 24 cm Movement: Present     General:  Alert, oriented and cooperative. Patient is in no acute distress.  Skin: Skin is warm and dry. No rash noted.   Cardiovascular: Normal heart rate noted  Respiratory: Normal respiratory effort, no problems with respiration noted  Abdomen: Soft, gravid, appropriate for gestational age. Pain/Pressure: Absent     Pelvic:  Cervical exam deferred        Extremities: Normal range of motion.  Edema: None  Mental Status: Normal mood and affect. Normal behavior. Normal judgment and thought content.   Assessment   23 y.o. G1P0000 at [redacted]w[redacted]d by  11/18/2019, by Last Menstrual Period presenting for routine prenatal visit  Plan   Pregnancy#1 Problems (from 02/11/19 to present)    Problem Noted Resolved   Supervision of normal first pregnancy, antepartum  03/25/2019 by Malachy Mood, MD No   Overview Addendum 07/03/2019  9:02 AM by Homero Fellers, [place]  Dating LMP Blood type: A/Positive/-- (08/14 0846)   Genetic Screen Declines genetic testing Antibody:Negative (08/14 0846)  Anatomic [place] Patient desires to have this done at [facility] can not afford here.  Rubella: 1.42 (08/14 0846) Varicella: Immune  GTT Third trimester:  RPR: Non Reactive (08/14 0846)   Rhogam  HBsAg: Negative (08/14 0846)   TDaP vaccine                        Flu Shot: Declined HIV: Non Reactive (08/14 0846)   Baby Food Breast- shown [email or web address]        Given Book   GBS:   Contraception  Pap:03/25/2019 LGSIL  CBB     CS/VBAC    Support Person                Preterm labor symptoms and general obstetric precautions including but not limited to vaginal bleeding, contractions, leaking of fluid and fetal movement were reviewed in detail with the patient. Please refer to After Visit Summary for other counseling recommendations.   Return in about 3 weeks (around 08/21/2019) for 28 week labs and routine prenatal.  Prentice Docker, MD, [place] 07/31/2019 10:39 AM

## 2019-08-24 ENCOUNTER — Ambulatory Visit (INDEPENDENT_AMBULATORY_CARE_PROVIDER_SITE_OTHER): Payer: No Typology Code available for payment source | Admitting: Obstetrics and Gynecology

## 2019-08-24 ENCOUNTER — Other Ambulatory Visit: Payer: No Typology Code available for payment source

## 2019-08-24 ENCOUNTER — Other Ambulatory Visit: Payer: Self-pay

## 2019-08-24 ENCOUNTER — Encounter: Payer: Self-pay | Admitting: Obstetrics and Gynecology

## 2019-08-24 VITALS — BP 106/70 | Wt 143.0 lb

## 2019-08-24 DIAGNOSIS — Z113 Encounter for screening for infections with a predominantly sexual mode of transmission: Secondary | ICD-10-CM

## 2019-08-24 DIAGNOSIS — Z131 Encounter for screening for diabetes mellitus: Secondary | ICD-10-CM

## 2019-08-24 DIAGNOSIS — Z3A27 27 weeks gestation of pregnancy: Secondary | ICD-10-CM

## 2019-08-24 DIAGNOSIS — Z3402 Encounter for supervision of normal first pregnancy, second trimester: Secondary | ICD-10-CM

## 2019-08-24 DIAGNOSIS — Z3400 Encounter for supervision of normal first pregnancy, unspecified trimester: Secondary | ICD-10-CM

## 2019-08-24 NOTE — Progress Notes (Signed)
  Routine Prenatal Care Visit  Subjective  Sonya Shannon is a 23 y.o. G1P0000 at [redacted]w[redacted]d being seen today for ongoing prenatal care.  She is currently monitored for the following issues for this low-risk pregnancy and has Epigastric hernia and Supervision of normal first pregnancy, antepartum on their problem list.  ----------------------------------------------------------------------------------- Patient reports no complaints.   Contractions: Not present. Vag. Bleeding: None.  Movement: Present. Leaking Fluid denies.  ----------------------------------------------------------------------------------- The following portions of the patient's history were reviewed and updated as appropriate: allergies, current medications, past family history, past medical history, past social history, past surgical history and problem list. Problem list updated.  Objective  Blood pressure 106/70, weight 143 lb (64.9 kg), last menstrual period 02/11/2019. Pregravid weight 116 lb (52.6 kg) Total Weight Gain 27 lb (12.2 kg) Urinalysis: Urine Protein    Urine Glucose    Fetal Status: Fetal Heart Rate (bpm): 140 Fundal Height: 28 cm Movement: Present     General:  Alert, oriented and cooperative. Patient is in no acute distress.  Skin: Skin is warm and dry. No rash noted.   Cardiovascular: Normal heart rate noted  Respiratory: Normal respiratory effort, no problems with respiration noted  Abdomen: Soft, gravid, appropriate for gestational age. Pain/Pressure: Absent     Pelvic:  Cervical exam deferred        Extremities: Normal range of motion.  Edema: None  Mental Status: Normal mood and affect. Normal behavior. Normal judgment and thought content.   Assessment   23 y.o. G1P0000 at [redacted]w[redacted]d by  11/18/2019, by Last Menstrual Period presenting for routine prenatal visit  Plan   Pregnancy#1 Problems (from 02/11/19 to present)    Problem Noted Resolved   Supervision of normal first pregnancy, antepartum  03/25/2019 by Malachy Mood, MD No   Overview Addendum 07/03/2019  9:02 AM by Homero Fellers, [place]  Dating LMP Blood type: A/Positive/-- (08/14 0846)   Genetic Screen Declines genetic testing Antibody:Negative (08/14 0846)  Anatomic [place] Patient desires to have this done at [facility], can not afford here.  Rubella: 1.42 (08/14 0846) Varicella: Immune  GTT Third trimester:  RPR: Non Reactive (08/14 0846)   Rhogam  HBsAg: Negative (08/14 0846)   TDaP vaccine                        Flu Shot: Declined HIV: Non Reactive (08/14 0846)   Baby Food Breast- shown [email or web address]        Given Book   GBS:   Contraception  Pap:03/25/2019 LGSIL  CBB     CS/VBAC    Support Person                Preterm labor symptoms and general obstetric precautions including but not limited to vaginal bleeding, contractions, leaking of fluid and fetal movement were reviewed in detail with the patient. Please refer to After Visit Summary for other counseling recommendations.   - 1 hour gtt today w 28 week labs  Return in about 2 weeks (around 09/07/2019) for Routine Prenatal Appointment.  Prentice Docker, MD, [facility], [facility] 08/24/2019 8:30 AM

## 2019-08-25 LAB — 28 WEEK RH+PANEL
Basophils Absolute: 0 10*3/uL (ref 0.0–0.2)
Basos: 0 %
EOS (ABSOLUTE): 0.1 10*3/uL (ref 0.0–0.4)
Eos: 1 %
Gestational Diabetes Screen: 64 mg/dL — ABNORMAL LOW (ref 65–139)
HIV Screen 4th Generation wRfx: NONREACTIVE
Hematocrit: 35.6 % (ref 34.0–46.6)
Hemoglobin: 11.9 g/dL (ref 11.1–15.9)
Immature Grans (Abs): 0 10*3/uL (ref 0.0–0.1)
Immature Granulocytes: 0 %
Lymphocytes Absolute: 1.6 10*3/uL (ref 0.7–3.1)
Lymphs: 22 %
MCH: 31.2 pg (ref 26.6–33.0)
MCHC: 33.4 g/dL (ref 31.5–35.7)
MCV: 93 fL (ref 79–97)
Monocytes Absolute: 0.5 10*3/uL (ref 0.1–0.9)
Monocytes: 8 %
Neutrophils Absolute: 4.8 10*3/uL (ref 1.4–7.0)
Neutrophils: 69 %
Platelets: 225 10*3/uL (ref 150–450)
RBC: 3.82 x10E6/uL (ref 3.77–5.28)
RDW: 12 % (ref 11.7–15.4)
RPR Ser Ql: NONREACTIVE
WBC: 7 10*3/uL (ref 3.4–10.8)

## 2019-08-26 ENCOUNTER — Ambulatory Visit: Payer: Managed Care, Other (non HMO) | Attending: Internal Medicine

## 2019-08-26 DIAGNOSIS — Z20822 Contact with and (suspected) exposure to covid-19: Secondary | ICD-10-CM

## 2019-08-27 LAB — NOVEL CORONAVIRUS, NAA: SARS-CoV-2, NAA: DETECTED — AB

## 2019-08-28 NOTE — L&D Delivery Note (Signed)
Delivery Note At 4:22 AM a viable female was delivered via Vaginal, Spontaneous (Presentation: Right Occiput Anterior).  APGAR: 8, 9; weight pending .   Placenta status: Spontaneous, Intact.  Cord: 3 vessels with the following complications: None.  Cord pH: N/A  Anesthesia: Epidural Episiotomy: None Lacerations: 2nd degree Suture Repair: 3.0 monocryl Est. Blood Loss (mL): 435  Mom to postpartum.  Baby to Couplet care / Skin to Skin.  Vena Austria 11/14/2019, 5:00 AM

## 2019-09-07 ENCOUNTER — Ambulatory Visit (INDEPENDENT_AMBULATORY_CARE_PROVIDER_SITE_OTHER): Payer: No Typology Code available for payment source | Admitting: Obstetrics and Gynecology

## 2019-09-07 ENCOUNTER — Other Ambulatory Visit: Payer: Self-pay

## 2019-09-07 DIAGNOSIS — Z3400 Encounter for supervision of normal first pregnancy, unspecified trimester: Secondary | ICD-10-CM

## 2019-09-07 DIAGNOSIS — Z3A29 29 weeks gestation of pregnancy: Secondary | ICD-10-CM

## 2019-09-07 DIAGNOSIS — Z3403 Encounter for supervision of normal first pregnancy, third trimester: Secondary | ICD-10-CM

## 2019-09-07 NOTE — Progress Notes (Signed)
  I connected with Sonya Shannon on 09/07/19 at 11:10 AM EST by telephone and verified that I am speaking with the correct person using two identifiers.   I discussed the limitations, risks, security and privacy concerns of performing an evaluation and management service by telephone and the availability of in person appointments. I also discussed with the patient that there may be a patient responsible charge related to this service. The patient expressed understanding and agreed to proceed.  The patient was at home. I spoke with the patient from my workstation phoneThe names of people involved in this encounter were: Melissaann Bachtel , and Vena Austria   Routine Prenatal Care Visit  Subjective  Sonya Shannon is a 24 y.o. G1P0000 at [redacted]w[redacted]d being seen today for ongoing prenatal care.  She is currently monitored for the following issues for this low-risk pregnancy and has Epigastric hernia and Supervision of normal first pregnancy, antepartum on their problem list.  ----------------------------------------------------------------------------------- Patient reports no complaints.   Contractions: Not present. Vag. Bleeding: None.  Movement: Present. Denies leaking of fluid.  ----------------------------------------------------------------------------------- The following portions of the patient's history were reviewed and updated as appropriate: allergies, current medications, past family history, past medical history, past social history, past surgical history and problem list. Problem list updated.   Objective  Last menstrual period 02/11/2019. Pregravid weight 116 lb (52.6 kg) Total Weight Gain 27 lb (12.2 kg) Urinalysis:      Fetal Status:     Movement: Present     No physical exam as this was a remote telephone visit to promote social distancing during the current COVID-19 Pandemic   Assessment   24 y.o. G1P0000 at [redacted]w[redacted]d by  11/18/2019, by Last Menstrual Period presenting  for routine prenatal visit  Plan   Pregnancy#1 Problems (from 02/11/19 to present)    Problem Noted Resolved   Supervision of normal first pregnancy, antepartum 03/25/2019 by Vena Austria, MD No   Overview Addendum 07/03/2019  9:02 AM by Natale Milch, MD    Clinic [facility]renatal [facility]  Dating LMP Blood type: A/Positive/-- (08/14 0846)   Genetic Screen Declines genetic testing Antibody:Negative (08/14 0846)  Anatomic [place] Patient desires to have this done at [facility], can not afford here.  Rubella: 1.42 (08/14 0846) Varicella: Immune  GTT 64 RPR: Non Reactive (08/14 0846)   Rhogam N/A HBsAg: Negative (08/14 0846)   TDaP vaccine                        Flu Shot: Declined HIV: Non Reactive (08/14 0846)   Baby Food Breast- shown [email or web address]        Given Book   GBS:   Contraception  Pap:03/25/2019 LGSIL  CBB     CS/VBAC    Support Person                Gestational age appropriate obstetric precautions including but not limited to vaginal bleeding, contractions, leaking of fluid and fetal movement were reviewed in detail with the patient.    Doing well, had very mild to minimal symptoms during the course of her COVID infection.  Is cleared to come back for office visits as 12 days out form her test and symptoms resolution  Telephone time 5:55 minutes  No follow-ups on file.  Vena Austria, MD, [facility], [facility] 09/07/2019, 11:44 AM

## 2019-09-21 ENCOUNTER — Other Ambulatory Visit: Payer: Self-pay

## 2019-09-21 ENCOUNTER — Encounter: Payer: Self-pay | Admitting: Obstetrics and Gynecology

## 2019-09-21 ENCOUNTER — Ambulatory Visit (INDEPENDENT_AMBULATORY_CARE_PROVIDER_SITE_OTHER): Payer: No Typology Code available for payment source | Admitting: Obstetrics and Gynecology

## 2019-09-21 VITALS — BP 118/74 | Wt 148.0 lb

## 2019-09-21 DIAGNOSIS — Z3400 Encounter for supervision of normal first pregnancy, unspecified trimester: Secondary | ICD-10-CM

## 2019-09-21 DIAGNOSIS — Z3A31 31 weeks gestation of pregnancy: Secondary | ICD-10-CM

## 2019-09-21 DIAGNOSIS — Z3403 Encounter for supervision of normal first pregnancy, third trimester: Secondary | ICD-10-CM

## 2019-09-21 DIAGNOSIS — Z23 Encounter for immunization: Secondary | ICD-10-CM | POA: Diagnosis not present

## 2019-09-21 NOTE — Progress Notes (Signed)
  Routine Prenatal Care Visit  Subjective  Sonya Shannon is a 24 y.o. G1P0000 at [redacted]w[redacted]d being seen today for ongoing prenatal care.  She is currently monitored for the following issues for this low-risk pregnancy and has Epigastric hernia and Supervision of normal first pregnancy, antepartum on their problem list.  ----------------------------------------------------------------------------------- Patient reports no complaints.   Contractions: Not present. Vag. Bleeding: None.  Movement: Present. Leaking Fluid denies.  ----------------------------------------------------------------------------------- The following portions of the patient's history were reviewed and updated as appropriate: allergies, current medications, past family history, past medical history, past social history, past surgical history and problem list. Problem list updated.  Objective  Blood pressure 118/74, weight 148 lb (67.1 kg), last menstrual period 02/11/2019. Pregravid weight 116 lb (52.6 kg) Total Weight Gain 32 lb (14.5 kg) Urinalysis: Urine Protein    Urine Glucose    Fetal Status: Fetal Heart Rate (bpm): 155 Fundal Height: 29 cm Movement: Present     General:  Alert, oriented and cooperative. Patient is in no acute distress.  Skin: Skin is warm and dry. No rash noted.   Cardiovascular: Normal heart rate noted  Respiratory: Normal respiratory effort, no problems with respiration noted  Abdomen: Soft, gravid, appropriate for gestational age. Pain/Pressure: Absent     Pelvic:  Cervical exam deferred        Extremities: Normal range of motion.  Edema: None  Mental Status: Normal mood and affect. Normal behavior. Normal judgment and thought content.   Assessment   24 y.o. G1P0000 at [redacted]w[redacted]d by  11/18/2019, by Last Menstrual Period presenting for routine prenatal visit  Plan   Pregnancy#1 Problems (from 02/11/19 to present)    Problem Noted Resolved   Supervision of normal first pregnancy, antepartum  03/25/2019 by Vena Austria, MD No   Overview Addendum 09/07/2019 11:43 AM by Vena Austria, MD    Clinic [facility] Prenatal Labs  Dating LMP Blood type: A/Positive/-- (08/14 0846)   Genetic Screen Declines genetic testing Antibody:Negative (08/14 0846)  Anatomic [place] Patient desires to have this done at [facility], can not afford here.  Rubella: 1.42 (08/14 0846) Varicella: Immune  GTT 64 RPR: Non Reactive (08/14 0846)   Rhogam N/A HBsAg: Negative (08/14 0846)   TDaP vaccine                        Flu Shot: Declined HIV: Non Reactive (08/14 0846)   Baby Food Breast- shown [email or web address]        Given Book   GBS:   Contraception  Pap:03/25/2019 LGSIL  CBB     CS/VBAC    Support Person                Preterm labor symptoms and general obstetric precautions including but not limited to vaginal bleeding, contractions, leaking of fluid and fetal movement were reviewed in detail with the patient. Please refer to After Visit Summary for other counseling recommendations.   - TDaP today  Return in about 2 weeks (around 10/05/2019) for Routine Prenatal Appointment.  Thomasene Mohair, MD, [facility], [facility] 09/21/2019 8:35 AM

## 2019-09-21 NOTE — Addendum Note (Signed)
Addended by: Liliane Shi on: 09/21/2019 08:40 AM   Modules accepted: Orders

## 2019-10-05 ENCOUNTER — Ambulatory Visit (INDEPENDENT_AMBULATORY_CARE_PROVIDER_SITE_OTHER): Payer: No Typology Code available for payment source | Admitting: Obstetrics and Gynecology

## 2019-10-05 ENCOUNTER — Other Ambulatory Visit: Payer: Self-pay

## 2019-10-05 VITALS — BP 112/68 | Wt 151.0 lb

## 2019-10-05 DIAGNOSIS — Z3A33 33 weeks gestation of pregnancy: Secondary | ICD-10-CM

## 2019-10-05 DIAGNOSIS — Z3403 Encounter for supervision of normal first pregnancy, third trimester: Secondary | ICD-10-CM

## 2019-10-05 DIAGNOSIS — Z3400 Encounter for supervision of normal first pregnancy, unspecified trimester: Secondary | ICD-10-CM

## 2019-10-05 LAB — POCT URINALYSIS DIPSTICK OB
Glucose, UA: NEGATIVE
POC,PROTEIN,UA: NEGATIVE

## 2019-10-05 NOTE — Progress Notes (Signed)
ROB

## 2019-10-05 NOTE — Progress Notes (Signed)
    Routine Prenatal Care Visit  Subjective  Sonya Shannon is a 24 y.o. G1P0000 at [redacted]w[redacted]d being seen today for ongoing prenatal care.  She is currently monitored for the following issues for this low-risk pregnancy and has Epigastric hernia and Supervision of normal first pregnancy, antepartum on their problem list.  ----------------------------------------------------------------------------------- Patient reports no complaints.   Contractions: Not present. Vag. Bleeding: None.  Movement: Present. Denies leaking of fluid.  ----------------------------------------------------------------------------------- The following portions of the patient's history were reviewed and updated as appropriate: allergies, current medications, past family history, past medical history, past social history, past surgical history and problem list. Problem list updated.   Objective  Blood pressure 112/68, weight 151 lb (68.5 kg), last menstrual period 02/11/2019. Pregravid weight 116 lb (52.6 kg) Total Weight Gain 35 lb (15.9 kg) Urinalysis:      Fetal Status: Fetal Heart Rate (bpm): 150 Fundal Height: 32 cm Movement: Present  Presentation: Vertex  General:  Alert, oriented and cooperative. Patient is in no acute distress.  Skin: Skin is warm and dry. No rash noted.   Cardiovascular: Normal heart rate noted  Respiratory: Normal respiratory effort, no problems with respiration noted  Abdomen: Soft, gravid, appropriate for gestational age. Pain/Pressure: Absent     Pelvic:  Cervical exam deferred        Extremities: Normal range of motion.  Edema: None  ental Status: Normal mood and affect. Normal behavior. Normal judgment and thought content.     Assessment   24 y.o. G1P0000 at [redacted]w[redacted]d by  11/18/2019, by Last Menstrual Period presenting for routine prenatal visit  Plan   Pregnancy#1 Problems (from 02/11/19 to present)    Problem Noted Resolved   Supervision of normal first pregnancy, antepartum  03/25/2019 by Vena Austria, MD No   Overview Addendum 09/07/2019 11:43 AM by Vena Austria, MD    Clinic [facility] Prenatal Labs  Dating LMP Blood type: A/Positive/-- (08/14 0846)   Genetic Screen Declines genetic testing Antibody:Negative (08/14 0846)  Anatomic [place] Patient desires to have this done at [facility], can not afford here.  Rubella: 1.42 (08/14 0846) Varicella: Immune  GTT 64 RPR: Non Reactive (08/14 0846)   Rhogam N/A HBsAg: Negative (08/14 0846)   TDaP vaccine                        Flu Shot: Declined HIV: Non Reactive (08/14 0846)   Baby Food Breast- shown [email or web address]        Given Book   GBS:   Contraception  Pap:03/25/2019 LGSIL  CBB     CS/VBAC    Support Person                Gestational age appropriate obstetric precautions including but not limited to vaginal bleeding, contractions, leaking of fluid and fetal movement were reviewed in detail with the patient.    Return in about 2 weeks (around 10/19/2019) for ROB.  Vena Austria, MD, Evern Core [facility], [facility] 10/05/2019, 10:20 AM

## 2019-10-05 NOTE — Addendum Note (Signed)
Addended by: [place], Ciji Boston B on: 10/05/2019 10:28 AM   Modules accepted: Orders

## 2019-10-19 ENCOUNTER — Other Ambulatory Visit: Payer: Self-pay

## 2019-10-19 ENCOUNTER — Other Ambulatory Visit (INDEPENDENT_AMBULATORY_CARE_PROVIDER_SITE_OTHER): Payer: No Typology Code available for payment source

## 2019-10-19 ENCOUNTER — Encounter: Payer: Self-pay | Admitting: Obstetrics and Gynecology

## 2019-10-19 ENCOUNTER — Ambulatory Visit (INDEPENDENT_AMBULATORY_CARE_PROVIDER_SITE_OTHER): Payer: No Typology Code available for payment source | Admitting: Obstetrics and Gynecology

## 2019-10-19 ENCOUNTER — Other Ambulatory Visit (HOSPITAL_COMMUNITY)
Admission: RE | Admit: 2019-10-19 | Discharge: 2019-10-19 | Disposition: A | Discharge status: Home / Self Care | Payer: 59 | Source: Ambulatory Visit | Attending: Obstetrics and Gynecology | Admitting: Obstetrics and Gynecology

## 2019-10-19 VITALS — BP 110/70 | Wt 155.0 lb

## 2019-10-19 DIAGNOSIS — Z3A35 35 weeks gestation of pregnancy: Secondary | ICD-10-CM

## 2019-10-19 DIAGNOSIS — Z3400 Encounter for supervision of normal first pregnancy, unspecified trimester: Secondary | ICD-10-CM | POA: Diagnosis present

## 2019-10-19 DIAGNOSIS — O26843 Uterine size-date discrepancy, third trimester: Secondary | ICD-10-CM | POA: Diagnosis not present

## 2019-10-19 DIAGNOSIS — Z362 Encounter for other antenatal screening follow-up: Secondary | ICD-10-CM | POA: Diagnosis not present

## 2019-10-19 DIAGNOSIS — Z3403 Encounter for supervision of normal first pregnancy, third trimester: Secondary | ICD-10-CM

## 2019-10-19 LAB — POCT URINALYSIS DIPSTICK OB
Glucose, UA: NEGATIVE
POC,PROTEIN,UA: NEGATIVE

## 2019-10-19 NOTE — Progress Notes (Signed)
    Routine Prenatal Care Visit  Subjective  Sonya Shannon is a 24 y.o. G1P0000 at [redacted]w[redacted]d being seen today for ongoing prenatal care.  She is currently monitored for the following issues for this low-risk pregnancy and has Epigastric hernia and Supervision of normal first pregnancy, antepartum on their problem list.  ----------------------------------------------------------------------------------- Patient reports no complaints.   Contractions: Not present. Vag. Bleeding: None.  Movement: Present. Denies leaking of fluid.  ----------------------------------------------------------------------------------- The following portions of the patient's history were reviewed and updated as appropriate: allergies, current medications, past family history, past medical history, past social history, past surgical history and problem list. Problem list updated.   Objective  Blood pressure 110/70, weight 155 lb (70.3 kg), last menstrual period 02/11/2019. Pregravid weight 116 lb (52.6 kg) Total Weight Gain 39 lb (17.7 kg) Urinalysis:      Fetal Status: Fetal Heart Rate (bpm): 133 Fundal Height: 31 cm Movement: Present     General:  Alert, oriented and cooperative. Patient is in no acute distress.  Skin: Skin is warm and dry. No rash noted.   Cardiovascular: Normal heart rate noted  Respiratory: Normal respiratory effort, no problems with respiration noted  Abdomen: Soft, gravid, appropriate for gestational age. Pain/Pressure: Absent     Pelvic:  Cervical exam deferred Dilation: 1 Effacement (%): 50 Station: -3  Extremities: Normal range of motion.  Edema: None  Mental Status: Normal mood and affect. Normal behavior. Normal judgment and thought content.     Assessment   24 y.o. G1P0000 at [redacted]w[redacted]d by  11/18/2019, by Last Menstrual Period presenting for routine prenatal visit  Plan   Pregnancy#1 Problems (from 02/11/19 to present)    Problem Noted Resolved   Supervision of normal first  pregnancy, antepartum 03/25/2019 by Vena Austria, MD No   Overview Addendum 10/19/2019  9:13 AM by Natale Milch, MD    Clinic [facility] Prenatal Labs  Dating LMP Blood type: A/Positive/-- (08/14 0846)   Genetic Screen Declines genetic testing Antibody:Negative (08/14 0846)  Anatomic [place] Patient desires to have this done at [facility], can not afford here.  Rubella: 1.42 (08/14 0846) Varicella: Immune  GTT 64 RPR: Non Reactive (08/14 0846)   Rhogam N/A HBsAg: Negative (08/14 0846)   TDaP vaccine  09/20/2018                      Flu Shot: Declined HIV: Non Reactive (08/14 0846)   Baby Food Breast- shown [email or web address]        Given Book   GBS:   Contraception  None- have reviewed options with patient and recommendations on pregnancy spacing Pap:03/25/2019 LGSIL  CBB     CS/VBAC  NA   Support Person  Boyfriend Denyse Amass               Gestational age appropriate obstetric precautions including but not limited to vaginal bleeding, contractions, leaking of fluid and fetal movement were reviewed in detail with the patient.     Reviewed options for birth control with the patient. She is not interested in birth control at this time.  Growth [place] today for FH discrepancy  GBS/GC/CT testing today.  Discussed L&D expectations- has not taken a birthing class yet through the hospital, encouraged.   Return in about 1 week (around 10/26/2019) for ROB in person.  Natale Milch MD [facility], [facility] 10/19/2019, 9:15 AM

## 2019-10-19 NOTE — Patient Instructions (Addendum)
[email or web address]   Third Trimester of Pregnancy The third trimester is from week 28 through week 40 (months 7 through 9). The third trimester is a time when the unborn baby (fetus) is growing rapidly. At the end of the ninth month, the fetus is about 20 inches in length and weighs 6-10 pounds. Body changes during your third trimester Your body will continue to go through many changes during pregnancy. The changes vary from woman to woman. During the third trimester:  Your weight will continue to increase. You can expect to gain 25-35 pounds (11-16 kg) by the end of the pregnancy.  You may begin to get stretch marks on your hips, abdomen, and breasts.  You may urinate more often because the fetus is moving lower into your pelvis and pressing on your bladder.  You may develop or continue to have heartburn. This is caused by increased hormones that slow down muscles in the digestive tract.  You may develop or continue to have constipation because increased hormones slow digestion and cause the muscles that push waste through your intestines to relax.  You may develop hemorrhoids. These are swollen veins (varicose veins) in the rectum that can itch or be painful.  You may develop swollen, bulging veins (varicose veins) in your legs.  You may have increased body aches in the pelvis, back, or thighs. This is due to weight gain and increased hormones that are relaxing your joints.  You may have changes in your hair. These can include thickening of your hair, rapid growth, and changes in texture. Some women also have hair loss during or after pregnancy, or hair that feels dry or thin. Your hair will most likely return to normal after your baby is born.  Your breasts will continue to grow and they will continue to become tender. A yellow fluid (colostrum) may leak from your breasts. This is the first milk you are producing for your baby.  Your belly button may stick out.  You may notice more  swelling in your hands, face, or ankles.  You may have increased tingling or numbness in your hands, arms, and legs. The skin on your belly may also feel numb.  You may feel short of breath because of your expanding uterus.  You may have more problems sleeping. This can be caused by the size of your belly, increased need to urinate, and an increase in your body's metabolism.  You may notice the fetus "dropping," or moving lower in your abdomen (lightening).  You may have increased vaginal discharge.  You may notice your joints feel loose and you may have pain around your pelvic bone. What to expect at prenatal visits You will have prenatal exams every 2 weeks until week 36. Then you will have weekly prenatal exams. During a routine prenatal visit:  You will be weighed to make sure you and the baby are growing normally.  Your blood pressure will be taken.  Your abdomen will be measured to track your baby's growth.  The fetal heartbeat will be listened to.  Any test results from the previous visit will be discussed.  You may have a cervical check near your due date to see if your cervix has softened or thinned (effaced).  You will be tested for Group B streptococcus. This happens between 35 and 37 weeks. Your health care provider may ask you:  What your birth plan is.  How you are feeling.  If you are feeling the baby move.  If you have  had any abnormal symptoms, such as leaking fluid, bleeding, severe headaches, or abdominal cramping.  If you are using any tobacco products, including cigarettes, chewing tobacco, and electronic cigarettes.  If you have any questions. Other tests or screenings that may be performed during your third trimester include:  Blood tests that check for low iron levels (anemia).  Fetal testing to check the health, activity level, and growth of the fetus. Testing is done if you have certain medical conditions or if there are problems during the  pregnancy.  Nonstress test (NST). This test checks the health of your baby to make sure there are no signs of problems, such as the baby not getting enough oxygen. During this test, a belt is placed around your belly. The baby is made to move, and its heart rate is monitored during movement. What is false labor? False labor is a condition in which you feel small, irregular tightenings of the muscles in the womb (contractions) that usually go away with rest, changing position, or drinking water. These are called Braxton Hicks contractions. Contractions may last for hours, days, or even weeks before true labor sets in. If contractions come at regular intervals, become more frequent, increase in intensity, or become painful, you should see your health care provider. What are the signs of labor?  Abdominal cramps.  Regular contractions that start at 10 minutes apart and become stronger and more frequent with time.  Contractions that start on the top of the uterus and spread down to the lower abdomen and back.  Increased pelvic pressure and dull back pain.  A watery or bloody mucus discharge that comes from the vagina.  Leaking of amniotic fluid. This is also known as your "water breaking." It could be a slow trickle or a gush. Let your health care provider know if it has a color or strange odor. If you have any of these signs, call your health care provider right away, even if it is before your due date. Follow these instructions at home: Medicines  Follow your health care provider's instructions regarding medicine use. Specific medicines may be either safe or unsafe to take during pregnancy.  Take a prenatal vitamin that contains at least 600 micrograms (mcg) of folic acid.  If you develop constipation, try taking a stool softener if your health care provider approves. Eating and drinking   Eat a balanced diet that includes fresh fruits and vegetables, whole grains, good sources of protein  such as meat, eggs, or tofu, and low-fat dairy. Your health care provider will help you determine the amount of weight gain that is right for you.  Avoid raw meat and uncooked cheese. These carry germs that can cause birth defects in the baby.  If you have low calcium intake from food, talk to your health care provider about whether you should take a daily calcium supplement.  Eat four or five small meals rather than three large meals a day.  Limit foods that are high in fat and processed sugars, such as fried and sweet foods.  To prevent constipation: ? Drink enough fluid to keep your urine clear or pale yellow. ? Eat foods that are high in fiber, such as fresh fruits and vegetables, whole grains, and beans. Activity  Exercise only as directed by your health care provider. Most women can continue their usual exercise routine during pregnancy. Try to exercise for 30 minutes at least 5 days a week. Stop exercising if you experience uterine contractions.  Avoid heavy  lifting.  Do not exercise in extreme heat or humidity, or at high altitudes.  Wear low-heel, comfortable shoes.  Practice good posture.  You may continue to have sex unless your health care provider tells you otherwise. Relieving pain and discomfort  Take frequent breaks and rest with your legs elevated if you have leg cramps or low back pain.  Take warm sitz baths to soothe any pain or discomfort caused by hemorrhoids. Use hemorrhoid cream if your health care provider approves.  Wear a good support bra to prevent discomfort from breast tenderness.  If you develop varicose veins: ? Wear support pantyhose or compression stockings as told by your healthcare provider. ? Elevate your feet for 15 minutes, 3-4 times a day. Prenatal care  Write down your questions. Take them to your prenatal visits.  Keep all your prenatal visits as told by your health care provider. This is important. Safety  Wear your seat belt at  all times when driving.  Make a list of emergency phone numbers, including numbers for family, friends, the hospital, and police and fire departments. General instructions  Avoid cat litter boxes and soil used by cats. These carry germs that can cause birth defects in the baby. If you have a cat, ask someone to clean the litter box for you.  Do not travel far distances unless it is absolutely necessary and only with the approval of your health care provider.  Do not use hot tubs, steam rooms, or saunas.  Do not drink alcohol.  Do not use any products that contain nicotine or tobacco, such as cigarettes and e-cigarettes. If you need help quitting, ask your health care provider.  Do not use any medicinal herbs or unprescribed drugs. These chemicals affect the formation and growth of the baby.  Do not douche or use tampons or scented sanitary pads.  Do not cross your legs for long periods of time.  To prepare for the arrival of your baby: ? Take prenatal classes to understand, practice, and ask questions about labor and delivery. ? Make a trial run to the hospital. ? Visit the hospital and tour the maternity area. ? Arrange for maternity or paternity leave through employers. ? Arrange for family and friends to take care of pets while you are in the hospital. ? Purchase a rear-facing car seat and make sure you know how to install it in your car. ? Pack your hospital bag. ? Prepare the baby's nursery. Make sure to remove all pillows and stuffed animals from the baby's crib to prevent suffocation.  Visit your dentist if you have not gone during your pregnancy. Use a soft toothbrush to brush your teeth and be gentle when you floss. Contact a health care provider if:  You are unsure if you are in labor or if your water has broken.  You become dizzy.  You have mild pelvic cramps, pelvic pressure, or nagging pain in your abdominal area.  You have lower back pain.  You have persistent  nausea, vomiting, or diarrhea.  You have an unusual or bad smelling vaginal discharge.  You have pain when you urinate. Get help right away if:  Your water breaks before 37 weeks.  You have regular contractions less than 5 minutes apart before 37 weeks.  You have a fever.  You are leaking fluid from your vagina.  You have spotting or bleeding from your vagina.  You have severe abdominal pain or cramping.  You have rapid weight loss or weight gain.    You have shortness of breath with chest pain.  You notice sudden or extreme swelling of your face, hands, ankles, feet, or legs.  Your baby makes fewer than 10 movements in 2 hours.  You have severe headaches that do not go away when you take medicine.  You have vision changes. Summary  The third trimester is from week 28 through week 40, months 7 through 9. The third trimester is a time when the unborn baby (fetus) is growing rapidly.  During the third trimester, your discomfort may increase as you and your baby continue to gain weight. You may have abdominal, leg, and back pain, sleeping problems, and an increased need to urinate.  During the third trimester your breasts will keep growing and they will continue to become tender. A yellow fluid (colostrum) may leak from your breasts. This is the first milk you are producing for your baby.  False labor is a condition in which you feel small, irregular tightenings of the muscles in the womb (contractions) that eventually go away. These are called Braxton Hicks contractions. Contractions may last for hours, days, or even weeks before true labor sets in.  Signs of labor can include: abdominal cramps; regular contractions that start at 10 minutes apart and become stronger and more frequent with time; watery or bloody mucus discharge that comes from the vagina; increased pelvic pressure and dull back pain; and leaking of amniotic fluid. This information is not intended to replace advice  given to you by your health care provider. Make sure you discuss any questions you have with your health care provider. Document Revised: 12/04/2018 Document Reviewed: 09/18/2016 Elsevier Patient Education  2020 [employer]  Vaginal Delivery  Vaginal delivery means that you give birth by pushing your baby out of your birth canal (vagina). A team of health care providers will help you before, during, and after vaginal delivery. Birth experiences are unique for every woman and every pregnancy, and birth experiences vary depending on where you choose to give birth. What happens when I arrive at the birth center or hospital? Once you are in labor and have been admitted into the hospital or birth center, your health care provider may:  Review your pregnancy history and any concerns that you have.  Insert an IV into one of your veins. This may be used to give you fluids and medicines.  Check your blood pressure, pulse, temperature, and heart rate (vital signs).  Check whether your bag of water (amniotic sac) has broken (ruptured).  Talk with you about your birth plan and discuss pain control options. Monitoring Your health care provider may monitor your contractions (uterine monitoring) and your baby's heart rate (fetal monitoring). You may need to be monitored:  Often, but not continuously (intermittently).  All the time or for long periods at a time (continuously). Continuous monitoring may be needed if: ? You are taking certain medicines, such as medicine to relieve pain or make your contractions stronger. ? You have pregnancy or labor complications. Monitoring may be done by:  Placing a special stethoscope or a handheld monitoring device on your abdomen to check your baby's heartbeat and to check for contractions.  Placing monitors on your abdomen (external monitors) to record your baby's heartbeat and the frequency and length of contractions.  Placing monitors inside your uterus  through your vagina (internal monitors) to record your baby's heartbeat and the frequency, length, and strength of your contractions. Depending on the type of monitor, it may remain in your  or on your baby's head until birth.  Telemetry. This is a type of continuous monitoring that can be done with external or internal monitors. Instead of having to stay in bed, you are able to move around during telemetry. Physical exam Your health care provider may perform frequent physical exams. This may include:  Checking how and where your baby is positioned in your uterus.  Checking your cervix to determine: ? Whether it is thinning out (effacing). ? Whether it is opening up (dilating). What happens during labor and delivery?  Normal labor and delivery is divided into the following three stages: Stage 1  This is the longest stage of labor.  This stage can last for hours or days.  Throughout this stage, you will feel contractions. Contractions generally feel mild, infrequent, and irregular at first. They get stronger, more frequent (about every 2-3 minutes), and more regular as you move through this stage.  This stage ends when your cervix is completely dilated to 4 inches (10 cm) and completely effaced. Stage 2  This stage starts once your cervix is completely effaced and dilated and lasts until the delivery of your baby.  This stage may last from 20 minutes to 2 hours.  This is the stage where you will feel an urge to push your baby out of your vagina.  You may feel stretching and burning pain, especially when the widest part of your baby's head passes through the vaginal opening (crowning).  Once your baby is delivered, the umbilical cord will be clamped and cut. This usually occurs after waiting a period of 1-2 minutes after delivery.  Your baby will be placed on your bare chest (skin-to-skin contact) in an upright position and covered with a warm blanket. Watch your baby for feeding cues, like  rooting or sucking, and help the baby to your breast for his or her first feeding. Stage 3  This stage starts immediately after the birth of your baby and ends after you deliver the placenta.  This stage may take anywhere from 5 to 30 minutes.  After your baby has been delivered, you will feel contractions as your body expels the placenta and your uterus contracts to control bleeding. What can I expect after labor and delivery?  After labor is over, you and your baby will be monitored closely until you are ready to go home to ensure that you are both healthy. Your health care team will teach you how to care for yourself and your baby.  You and your baby will stay in the same room (rooming in) during your hospital stay. This will encourage early bonding and successful breastfeeding.  You may continue to receive fluids and medicines through an IV.  Your uterus will be checked and massaged regularly (fundal massage).  You will have some soreness and pain in your abdomen, vagina, and the area of skin between your vaginal opening and your anus (perineum).  If an incision was made near your vagina (episiotomy) or if you had some vaginal tearing during delivery, cold compresses may be placed on your episiotomy or your tear. This helps to reduce pain and swelling.  You may be given a squirt bottle to use instead of wiping when you go to the bathroom. To use the squirt bottle, follow these steps: ? Before you urinate, fill the squirt bottle with warm water. Do not use hot water. ? After you urinate, while you are sitting on the toilet, use the squirt bottle to rinse the area   around your urethra and vaginal opening. This rinses away any urine and blood. ? Fill the squirt bottle with clean water every time you use the bathroom.  It is normal to have vaginal bleeding after delivery. Wear a sanitary pad for vaginal bleeding and discharge. Summary  Vaginal delivery means that you will give birth by  pushing your baby out of your birth canal (vagina).  Your health care provider may monitor your contractions (uterine monitoring) and your baby's heart rate (fetal monitoring).  Your health care provider may perform a physical exam.  Normal labor and delivery is divided into three stages.  After labor is over, you and your baby will be monitored closely until you are ready to go home. This information is not intended to replace advice given to you by your health care provider. Make sure you discuss any questions you have with your health care provider. Document Revised: 09/17/2017 Document Reviewed: 09/17/2017 Elsevier Patient Education  2020 [employer]   Pain Relief During Labor and Delivery Many things can cause pain during labor and delivery, including:  Pressure on bones and ligaments due to the baby moving through the pelvis.  Stretching of tissues due to the baby moving through the birth canal.  Muscle tension due to anxiety or nervousness.  The uterus tightening (contracting) and relaxing to help move the baby. There are many ways to deal with the pain of labor and delivery. They include:  Taking prenatal classes. Taking these classes helps you know what to expect during your baby's birth. What you learn will increase your confidence and decrease your anxiety.  Practicing relaxation techniques or doing relaxing activities, such as: ? Focused breathing. ? Meditation. ? Visualization. ? Aroma therapy. ? Listening to your favorite music. ? Hypnosis.  Taking a warm shower or bath (hydrotherapy). This may: ? Provide comfort and relaxation. ? Lessen your perception of pain. ? Decrease the amount of pain medicine needed. ? Decrease the length of labor.  Getting a massage or counterpressure on your back.  Applying warm packs or ice packs.  Changing positions often, moving around, or using a birthing ball.  Getting: ? Pain medicine through an IV or injection into a  muscle. ? Pain medicine inserted into your spinal column. ? Injections of sterile water just under the skin on your lower back (intradermal injections). ? Laughing gas (nitrous oxide). Discuss your pain control options with your health care provider during your prenatal visits. Explore the options offered by your hospital or birth center. What kinds of medicine are available? There are two kinds of medicines that can be used to relieve pain during labor and delivery:  Analgesics. These medicines decrease pain without causing you to lose feeling or the ability to move your muscles.  Anesthetics. These medicines block feeling in the body and can decrease your ability to move freely. Both of these kinds of medicine can cause minor side effects, such as nausea, trouble concentrating, and sleepiness. They can also decrease the baby's heart rate before birth and affect the baby's breathing rate after birth. For this reason, health care providers are careful about when and how much medicine is given. What are specific medicines and procedures that provide pain relief? Local Anesthetics Local anesthetics are used to numb a small area of the body. They may be used along with another kind of anesthetic or used to numb the nerves of the vagina, cervix, and perineum during the second stage of labor. General Anesthetics General anesthetics cause you   to lose consciousness so you do not feel pain. They are usually only used for an emergency cesarean delivery. General anesthetics are given through an IV tube and a mask. Pudendal Block A pudendal block is a form of local anesthetic. It may be used to relieve the pain associated with pushing or stretching of the perineum at the time of delivery or to further numb the perineum. A pudendal block is done by injecting numbing medicine through the vaginal wall into a nerve in the pelvis. Epidural Analgesia Epidural analgesia is given through a flexible  IV catheter that is inserted into the lower back. Numbing medicine is delivered continuously to the area near your spinal column nerves (epidural space). After having this type of analgesia, you may be able to move your legs but you most likely will not be able to walk. Depending on the amount of medicine given, you may lose all feeling in the lower half of your body, or you may retain some level of sensation, including the urge to push. Epidural analgesia can be used to provide pain relief for a vaginal birth. Spinal Block A spinal block is similar to epidural analgesia, but the medicine is injected into the spinal fluid instead of the epidural space. A spinal block is only given once. It starts to relieve pain quickly, but the pain relief lasts only 1-6 hours. Spinal blocks can be used for cesarean deliveries. Combined Spinal-Epidural (CSE) Block A CSE block combines the effects of a spinal block and epidural analgesia. The spinal block works quickly to block all pain. The epidural analgesia provides continuous pain relief, even after the effects of the spinal block have worn off. This information is not intended to replace advice given to you by your health care provider. Make sure you discuss any questions you have with your health care provider. Document Revised: 07/26/2017 Document Reviewed: 01/04/2016 Elsevier Patient Education  2020 [employer].

## 2019-10-19 NOTE — Progress Notes (Signed)
ROB GBS/Aptima  No concerns Denies lof, no vb, Good FM

## 2019-10-21 LAB — CERVICOVAGINAL ANCILLARY ONLY
Chlamydia: NEGATIVE
Comment: NEGATIVE
Comment: NEGATIVE
Comment: NORMAL
Neisseria Gonorrhea: NEGATIVE
Trichomonas: NEGATIVE

## 2019-10-22 NOTE — Progress Notes (Signed)
Please call patient with normal lab result

## 2019-10-23 LAB — CULTURE, BETA STREP (GROUP B ONLY): Strep Gp B Culture: NEGATIVE

## 2019-10-27 ENCOUNTER — Ambulatory Visit (INDEPENDENT_AMBULATORY_CARE_PROVIDER_SITE_OTHER): Payer: No Typology Code available for payment source | Admitting: Advanced Practice Midwife

## 2019-10-27 ENCOUNTER — Encounter: Payer: Self-pay | Admitting: Advanced Practice Midwife

## 2019-10-27 ENCOUNTER — Other Ambulatory Visit: Payer: Self-pay

## 2019-10-27 VITALS — BP 118/74 | Wt 161.0 lb

## 2019-10-27 DIAGNOSIS — Z3A36 36 weeks gestation of pregnancy: Secondary | ICD-10-CM

## 2019-10-27 DIAGNOSIS — Z3403 Encounter for supervision of normal first pregnancy, third trimester: Secondary | ICD-10-CM

## 2019-10-27 NOTE — Progress Notes (Signed)
No vb. No lof.  

## 2019-10-27 NOTE — Progress Notes (Signed)
Routine Prenatal Care Visit  Subjective  Sonya Shannon is a 24 y.o. G1P0000 at [redacted]w[redacted]d being seen today for ongoing prenatal care.  She is currently monitored for the following issues for this low-risk pregnancy and has Epigastric hernia and Supervision of normal first pregnancy, antepartum on their problem list.  ----------------------------------------------------------------------------------- Patient reports no complaints.   Contractions: Not present. Vag. Bleeding: None.  Movement: Present. Leaking Fluid denies.  ----------------------------------------------------------------------------------- The following portions of the patient's history were reviewed and updated as appropriate: allergies, current medications, past family history, past medical history, past social history, past surgical history and problem list. Problem list updated.  Objective  Blood pressure 118/74, weight 161 lb (73 kg), last menstrual period 02/11/2019. Pregravid weight 116 lb (52.6 kg) Total Weight Gain 45 lb (20.4 kg) Urinalysis: Urine Protein    Urine Glucose    Fetal Status: Fetal Heart Rate (bpm): 150 Fundal Height: 35 cm Movement: Present     General:  Alert, oriented and cooperative. Patient is in no acute distress.  Skin: Skin is warm and dry. No rash noted.   Cardiovascular: Normal heart rate noted  Respiratory: Normal respiratory effort, no problems with respiration noted  Abdomen: Soft, gravid, appropriate for gestational age. Pain/Pressure: Absent     Pelvic:  Cervical exam deferred        Extremities: Normal range of motion.     Mental Status: Normal mood and affect. Normal behavior. Normal judgment and thought content.   The following were addressed during this visit:  Breastfeeding Education - Early initiation of breastfeeding    Comments: Keeps milk supply adequate, helps contract uterus and slow bleeding, and early milk is the perfect first food and is easy to digest.   - The  importance of exclusive breastfeeding    Comments: Provides antibodies, Lower risk of breast and ovarian cancers, and type-2 diabetes,Helps your body recover, Reduced chance of SIDS.   - Risks of giving your baby anything other than breast milk if you are breastfeeding    Comments: Make the baby less content with breastfeeds, may make my baby more susceptible to illness, and may reduce my milk supply.   - The importance of early skin-to-skin contact    Comments: Keeps baby warm and secure, helps keep baby's blood sugar up and breathing steady, easier to bond and breastfeed, and helps calm baby.  - Rooming-in on a 24-hour basis    Comments: Easier to learn baby's feeding cues, easier to bond and get to know each other, and encourages milk production.   - Feeding on demand or baby-led feeding    Comments: Helps prevent breastfeeding complications, helps bring in good milk supply, prevents under or overfeeding, and helps baby feel content and satisfied   - Frequent feeding to help assure optimal milk production    Comments: Making a full supply of milk requires frequent removal of milk from breasts, infant will eat 8-12 times in 24 hours, if separated from infant use breast massage, hand expression and/ or pumping to remove milk from breasts.   - Effective positioning and attachment    Comments: Helps my baby to get enough breast milk, helps to produce an adequate milk supply, and helps prevent nipple pain and damage   - Exclusive breastfeeding for the first 6 months    Comments: Builds a healthy milk supply and keeps it up, protects baby from sickness and disease, and breastmilk has everything your baby needs for the first 6 months.    Assessment  24 y.o. G1P0000 at [redacted]w[redacted]d by  11/18/2019, by Last Menstrual Period presenting for routine prenatal visit  Plan   Pregnancy#1 Problems (from 02/11/19 to present)    Problem Noted Resolved   Supervision of normal first pregnancy,  antepartum 03/25/2019 by Vena Austria, MD No   Overview Addendum 10/19/2019  9:13 AM by Natale Milch, MD    [facility] Prenatal Labs  Dating LMP Blood type: A/Positive/-- (08/14 0846)   Genetic Screen Declines genetic testing Antibody:Negative (08/14 0846)  Anatomic [place] Patient desires to have this done at [facility], can not afford here.  Rubella: 1.42 (08/14 0846) Varicella: Immune  GTT 64 RPR: Non Reactive (08/14 0846)   Rhogam N/A HBsAg: Negative (08/14 0846)   TDaP vaccine  09/20/2018                      Flu Shot: Declined HIV: Non Reactive (08/14 0846)   Baby Food Breast- shown [email or web address]        Given Book   GBS:   Contraception  None- have reviewed options with patient and recommendations on pregnancy spacing Pap:03/25/2019 LGSIL  CBB     CS/VBAC  NA   Support Person  Boyfriend Denyse Amass               Preterm labor symptoms and general obstetric precautions including but not limited to vaginal bleeding, contractions, leaking of fluid and fetal movement were reviewed in detail with the patient. Please refer to After Visit Summary for other counseling recommendations.   Return in about 1 week (around 11/03/2019) for rob.  Tresea Mall, CNM 10/27/2019 4:16 PM

## 2019-10-27 NOTE — Patient Instructions (Signed)

## 2019-10-27 NOTE — Lactation Note (Signed)
Lactation Consultation Note  Patient Name: Sonya Shannon Today's Date: 10/27/2019     Maternal Data    Feeding    LATCH Score                   Interventions    Lactation Tools Discussed/Used     Consult Status   Lactation student discussed benefits of breastfeeding per the Ready, Set, Baby curriculum. [name] encouraged to review breastfeeding information on Ready, set, Baby web site and given information for virtual breastfeeding classes.     Willa Rough Monterrio Gerst 10/27/2019, 4:07 PM

## 2019-11-03 ENCOUNTER — Other Ambulatory Visit: Payer: Self-pay

## 2019-11-03 ENCOUNTER — Ambulatory Visit (INDEPENDENT_AMBULATORY_CARE_PROVIDER_SITE_OTHER): Payer: 59 | Admitting: Obstetrics and Gynecology

## 2019-11-03 VITALS — BP 128/78 | Wt 162.0 lb

## 2019-11-03 DIAGNOSIS — Z3A37 37 weeks gestation of pregnancy: Secondary | ICD-10-CM

## 2019-11-03 DIAGNOSIS — Z3400 Encounter for supervision of normal first pregnancy, unspecified trimester: Secondary | ICD-10-CM

## 2019-11-03 NOTE — Progress Notes (Signed)
Obstetric H&P   Chief Complaint: ROB  Prenatal Care Provider: [facility]  History of Present Illness: 24 y.o. G1P0000 [redacted]w[redacted]d by 11/18/2019, by Last Menstrual Period presenting for routine obstetric visit today and to discuss delivery planning.  +FM, no LOF, no VB, no regular contractions.  Pregnancy has been uncomplicated other than LGSIL pap with plan to repeat in 12 months per ASCCP guidelines.     Pregravid weight 116 lb (52.6 kg) Total Weight Gain 46 lb (20.9 kg)  Pregnancy#1 Problems (from 02/11/19 to present)    Problem Noted Resolved   Supervision of normal first pregnancy, antepartum 03/25/2019 by Vena Austria, MD No   Overview Addendum 10/19/2019  9:13 AM by Natale Milch, MD    [facility] Prenatal Labs  Dating LMP Blood type: A/Positive/-- (08/14 0846)   Genetic Screen Declines genetic testing Antibody:Negative (08/14 0846)  Anatomic [place] Patient desires to have this done at [facility], can not afford here.  Rubella: 1.42 (08/14 0846) Varicella: Immune  GTT 64 RPR: Non Reactive (08/14 0846)   Rhogam N/A HBsAg: Negative (08/14 0846)   TDaP vaccine  09/20/2018                      Flu Shot: Declined HIV: Non Reactive (08/14 0846)   Baby Food Breast- shown [email or web address]        Given Book   GBS: negative  Contraception  None- have reviewed options with patient and recommendations on pregnancy spacing Pap:03/25/2019 LGSIL  CBB     CS/VBAC  NA   Support Person  Boyfriend Denyse Amass               Review of Systems: 10 point review of systems negative unless otherwise noted in HPI  Past Medical History: Patient Active Problem List   Diagnosis Date Noted  . Supervision of normal first pregnancy, antepartum 03/25/2019    [facility]inic [facility] Prenatal Labs  Dating LMP Blood type: A/Positive/-- (08/14 0846)   Genetic Screen Declines genetic testing Antibody:Negative (08/14 6295)  Anatomic [place] Patient desires to have this done at [facility], can not afford here.  Rubella:  1.42 (08/14 0846) Varicella: Immune  GTT 64 RPR: Non Reactive (08/14 0846)   Rhogam N/A HBsAg: Negative (08/14 0846)   TDaP vaccine  09/20/2018                      Flu Shot: Declined HIV: Non Reactive (08/14 0846)   Baby Food Breast- shown [email or web address]        Given Book   GBS:   Contraception  None- have reviewed options with patient and recommendations on pregnancy spacing Pap:03/25/2019 LGSIL  CBB     CS/VBAC  NA   Support Person  Boyfriend Denyse Amass        . Epigastric hernia 11/27/2012    1 cm nonreducible mass in the midline and 8 cm above the umbilicus.     Past Surgical History: Past Surgical History:  Procedure Laterality Date  . No past surgery      Past Obstetric History: # 1 - Date: None, Sex: None, Weight: None, GA: None, Delivery: None, Apgar1: None, Apgar5: None, Living: None, Birth Comments: None   Past Gynecologic History:  Family History: No family history on file.  Social History: Social History   Socioeconomic History  . Marital status: Single    Spouse name: Not on file  . Number of children: Not on file  . Years of  education: Not on file  . Highest education level: Not on file  Occupational History  . Occupation: [employer]   Tobacco Use  . Smoking status: Former Games developer  . Smokeless tobacco: Never Used  Substance and Sexual Activity  . Alcohol use: Not Currently  . Drug use: Yes    Types: Marijuana    Comment: Stoped when found out pregnant  . Sexual activity: Yes    Birth control/protection: None  Other Topics Concern  . Not on file  Social History Narrative  . Not on file   Social Determinants of Health   Financial Resource Strain:   . Difficulty of Paying Living Expenses: Not on file  Food Insecurity:   . Worried About Programme researcher, broadcasting/film/video in the Last Year: Not on file  . Ran Out of Food in the Last Year: Not on file  Transportation Needs:   . Lack of Transportation (Medical): Not on file  . Lack of Transportation  (Non-Medical): Not on file  Physical Activity:   . Days of Exercise per Week: Not on file  . Minutes of Exercise per Session: Not on file  Stress:   . Feeling of Stress : Not on file  Social Connections:   . Frequency of Communication with Friends and Family: Not on file  . Frequency of Social Gatherings with Friends and Family: Not on file  . Attends Religious Services: Not on file  . Active Member of Clubs or Organizations: Not on file  . Attends Banker Meetings: Not on file  . Marital Status: Not on file  Intimate Partner Violence:   . Fear of Current or Ex-Partner: Not on file  . Emotionally Abused: Not on file  . Physically Abused: Not on file  . Sexually Abused: Not on file    Medications: Prior to Admission medications   Not on File    Allergies: No Known Allergies  Physical Exam: Vitals: Blood pressure 128/78, weight 162 lb (73.5 kg), last menstrual period 02/11/2019.  FHT: 145  General: NAD HEENT: normocephalic, anicteric Pulmonary: No increased work of breathing Abdomen: Gravid, non-tender Leopolds: vtx Genitourinary: 1/50/-3 medium consistency, mid position Extremities: no edema, erythema, or tenderness Neurologic: Grossly intact Psychiatric: mood appropriate, affect full  Labs: No results found for this or any previous visit (from the past 24 hour(s)).  Assessment: 24 y.o. G1P0000 [redacted]w[redacted]d by 11/18/2019, by Last Menstrual Period presenting for ROB and to discuss delivery planning  Plan: 1) The ARRIVE study was a national multicenter trial that randomized 6,106 patients to induction of labor at 39 weeks 0 days to 39 weeks 4 days (3,062) compared to expectant management (3,044).  There was no significant difference in adverse perinatal outcomes but there was a significantly lower rate of cesarean delivery, as well as lower rate of maternal hypertensive complications in the induction group.  Number to treat was calculated as 28 induction of labor to  prevent 1 primary Cesarean section.  "Labor Induction versus Expectant Management in Nulliparous Low-Risk Women" The [place] Journal of Medicine iAugust 9, 2018 Vol. 379 No. 6   2) Fetus - 145 BOM  3) PNL - Blood type A/Positive/-- (08/14 0846) / Anti-bodyscreen Negative (08/14 0846) / Rubella 1.42 (08/14 0846) / Varicella Immune / RPR Non Reactive (12/28 1244) / HBsAg Negative (08/14 0846) / HIV Non Reactive (12/28 1244) / 1-hr OGTT 64 / GBS Negative/-- (02/22 1626)  4) Immunization History -  Immunization History  Administered Date(s) Administered  . Tdap 09/21/2019  Malachy Mood, MD, [facility], [facility] 11/03/2019, 12:42 PM

## 2019-11-03 NOTE — Progress Notes (Signed)
  [facility] INDUCTION ASSESSMENT Sonya Shannon Liaw May 12, 1996 Medical record #: [id number] Phone #:  Home Phone [phone]  Mobile [phone]  Home Phone [phone]    [facility]renatal Provider:[facility] Proposed admission date/time:11/13/2019 0800 Method of induction:Cytotec  Weight: Filed Weights03/09/21 1131Weight:162 lb (73.5 kg) BMI Body mass index is 24.63 kg/m. HIV Negative HSV Negative EDC Estimated Date of Delivery: 3/24/21based on:LMP  Gestational age on admission: [redacted]w[redacted]d Gravidity/parity:G1P0000  Cervix Score   0 1 2 3   Position Posterior Midposition Anterior   Consistency Firm Medium Soft   Effacement (%) 0-30 40-50 60-70 >80  Dilation (cm) Closed 1-2 3-4 >5  Baby's station -3 -2 -1 +1, +2   Bishop Score:4   Medical induction of labor  select indication(s) below Elective induction ?39 weeks multiparous patient ?39 weeks primiparous patient with Bishop score ?7 ?40 weeks primiparous patient   Medical Indications Adapted from ACOG Committee Opinion #560, "Medically Indicated Late Preterm and Early Term Deliveries," 2013.  PLACENTAL / UTERINE ISSUES FETAL ISSUES MATERNAL ISSUES  ? Placenta previa (36.0-37.6) ? Isoimmunization (37.0-38.6) ? Preeclampsia without severe features or gestational HTN (37.0)  ? Suspected accreta (34.0-35.6) ? Growth Restriction 10[birth year]18-1984) ? Preeclampsia with severe features (34.0)  ? Prior classical CD, uterine window, rupture (36.0-37.6) ? Isolated (38.0-39.6) ? Chronic HTN (38.0-39.6)  ? Prior myomectomy (37.0-38.6) ? Concurrent findings (34.0-37.6) ? Cholestasis (37.0)  ? Umbilical vein varix (37.0) ? Growth Restriction (Twins) ? Diabetes  ? Placental abruption (chronic) ? Di-Di Isolated (36.0-37.6) ? Pregestational, controlled (39.0)  OBSTETRIC ISSUES ? Di-Di concurrent findings (32.0-34.6) ? Pregestational, uncontrolled (37.0-39.0)  ? Postdates ? (41 weeks) ? Mo-Di isolated  (32.0-34.6) ? Pregestational, vascular compromise (37.0- 39.0)  ? PPROM (34.0) ? Multiple Gestation ? Gestational, diet controlled (40.0)  ? Hx of IUFD (39.0 weeks) ? Di-Di (38.0-38.6) ? Gestational, med controlled (39.0)  ? Polyhydramnios, mild/moderate; SDV 8-16 or AFI 25-35 (39.0) ? Mo-Di (36.0-37.6) ? Gestational, uncontrolled (38.0-39.0)  ? Oligohydramnios (36.0-37.6); MVP <2 cm  For indications not listed above, delivery recommendations from maternal-fetal medicine consultant occurred on:  Provider Signature: 11-[birth year]-1986 Scheduled by:AMS Date:11/03/2019 12:46 PM   Call [phone] to finalize the induction date/time  [phone] (07/17)

## 2019-11-03 NOTE — Progress Notes (Signed)
ROB

## 2019-11-10 ENCOUNTER — Other Ambulatory Visit: Payer: Self-pay

## 2019-11-10 ENCOUNTER — Ambulatory Visit (INDEPENDENT_AMBULATORY_CARE_PROVIDER_SITE_OTHER): Payer: 59 | Admitting: Certified Nurse Midwife

## 2019-11-10 VITALS — BP 110/70 | Temp 96.1°F | Wt 161.0 lb

## 2019-11-10 DIAGNOSIS — Z3400 Encounter for supervision of normal first pregnancy, unspecified trimester: Secondary | ICD-10-CM

## 2019-11-10 DIAGNOSIS — O471 False labor at or after 37 completed weeks of gestation: Secondary | ICD-10-CM

## 2019-11-10 DIAGNOSIS — Z3A38 38 weeks gestation of pregnancy: Secondary | ICD-10-CM

## 2019-11-10 LAB — POCT URINALYSIS DIPSTICK OB
Glucose, UA: NEGATIVE
POC,PROTEIN,UA: NEGATIVE

## 2019-11-10 NOTE — Progress Notes (Signed)
ROB at 38wk6d: Doing well. Having BH contractions. No vaginal bleeding or leakage of water. Baby active. Set up for an induction on Friday 3/19 at 0800  FH 34 cm/ FHT 136/ cephalic/ Cervix very posterior/ ?1/40-50%/-1/soft Breast Labor precautions  Farrel Conners, CNM

## 2019-11-13 ENCOUNTER — Other Ambulatory Visit: Payer: Self-pay

## 2019-11-13 ENCOUNTER — Inpatient Hospital Stay: Payer: No Typology Code available for payment source | Admitting: Anesthesiology

## 2019-11-13 ENCOUNTER — Inpatient Hospital Stay
Admission: RE | Admit: 2019-11-13 | Discharge: 2019-11-15 | DRG: 807 | Disposition: A | Discharge status: Home / Self Care | Payer: No Typology Code available for payment source | Attending: Obstetrics and Gynecology | Admitting: Obstetrics and Gynecology

## 2019-11-13 ENCOUNTER — Encounter: Payer: Self-pay | Admitting: Advanced Practice Midwife

## 2019-11-13 DIAGNOSIS — Z87891 Personal history of nicotine dependence: Secondary | ICD-10-CM

## 2019-11-13 DIAGNOSIS — Z3400 Encounter for supervision of normal first pregnancy, unspecified trimester: Secondary | ICD-10-CM

## 2019-11-13 DIAGNOSIS — Z3A39 39 weeks gestation of pregnancy: Secondary | ICD-10-CM | POA: Diagnosis not present

## 2019-11-13 DIAGNOSIS — O26893 Other specified pregnancy related conditions, third trimester: Secondary | ICD-10-CM | POA: Diagnosis present

## 2019-11-13 DIAGNOSIS — Z3490 Encounter for supervision of normal pregnancy, unspecified, unspecified trimester: Secondary | ICD-10-CM

## 2019-11-13 LAB — CBC
HCT: 36.7 % (ref 36.0–46.0)
Hemoglobin: 12 g/dL (ref 12.0–15.0)
MCH: 29.1 pg (ref 26.0–34.0)
MCHC: 32.7 g/dL (ref 30.0–36.0)
MCV: 89.1 fL (ref 80.0–100.0)
Platelets: 279 10*3/uL (ref 150–400)
RBC: 4.12 MIL/uL (ref 3.87–5.11)
RDW: 13.3 % (ref 11.5–15.5)
WBC: 10.1 10*3/uL (ref 4.0–10.5)
nRBC: 0 % (ref 0.0–0.2)

## 2019-11-13 LAB — TYPE AND SCREEN
ABO/RH(D): A POS
Antibody Screen: NEGATIVE

## 2019-11-13 LAB — ABO/RH: ABO/RH(D): A POS

## 2019-11-13 MED ORDER — OXYTOCIN 40 UNITS IN NORMAL SALINE INFUSION - SIMPLE MED
1.0000 m[IU]/min | INTRAVENOUS | Status: DC
  Administered 2019-11-13: 2 m[IU]/min via INTRAVENOUS
  Filled 2019-11-13: qty 1000

## 2019-11-13 MED ORDER — ONDANSETRON HCL 4 MG/2ML IJ SOLN
4.0000 mg | Freq: Four times a day (QID) | INTRAMUSCULAR | Status: DC | PRN
  Administered 2019-11-13: 4 mg via INTRAVENOUS
  Filled 2019-11-13: qty 2

## 2019-11-13 MED ORDER — MISOPROSTOL 25 MCG QUARTER TABLET
25.0000 ug | ORAL_TABLET | Freq: Once | ORAL | Status: AC
  Administered 2019-11-13: 25 ug via BUCCAL
  Filled 2019-11-13: qty 1

## 2019-11-13 MED ORDER — LIDOCAINE-EPINEPHRINE (PF) 1.5 %-1:200000 IJ SOLN
INTRAMUSCULAR | Status: DC | PRN
  Administered 2019-11-13: 3 mL via EPIDURAL

## 2019-11-13 MED ORDER — LIDOCAINE HCL (PF) 1 % IJ SOLN
30.0000 mL | INTRAMUSCULAR | Status: AC | PRN
  Administered 2019-11-13: 1 mL via SUBCUTANEOUS
  Filled 2019-11-13: qty 30

## 2019-11-13 MED ORDER — ACETAMINOPHEN 325 MG PO TABS: 650.0000 mg | ORAL_TABLET | ORAL | Status: DC | PRN

## 2019-11-13 MED ORDER — FENTANYL 2.5 MCG/ML W/ROPIVACAINE 0.15% IN NS 100 ML EPIDURAL (ARMC)
12.0000 mL/h | EPIDURAL | Status: DC
  Administered 2019-11-13: 12 mL/h via EPIDURAL
  Filled 2019-11-13: qty 100

## 2019-11-13 MED ORDER — AMMONIA AROMATIC IN INHA
RESPIRATORY_TRACT | Status: AC
  Filled 2019-11-13: qty 10

## 2019-11-13 MED ORDER — OXYTOCIN 40 UNITS IN NORMAL SALINE INFUSION - SIMPLE MED: 2.5000 [IU]/h | INTRAVENOUS | Status: DC

## 2019-11-13 MED ORDER — OXYTOCIN BOLUS FROM INFUSION
500.0000 mL | Freq: Once | INTRAVENOUS | Status: AC
  Administered 2019-11-14: 04:00:00 500 mL via INTRAVENOUS

## 2019-11-13 MED ORDER — PHENYLEPHRINE 40 MCG/ML (10ML) SYRINGE FOR IV PUSH (FOR BLOOD PRESSURE SUPPORT): 80.0000 ug | PREFILLED_SYRINGE | INTRAVENOUS | Status: DC | PRN

## 2019-11-13 MED ORDER — LACTATED RINGERS IV SOLN: 500.0000 mL | INTRAVENOUS | Status: DC | PRN

## 2019-11-13 MED ORDER — LACTATED RINGERS IV SOLN: INTRAVENOUS | Status: DC

## 2019-11-13 MED ORDER — TERBUTALINE SULFATE 1 MG/ML IJ SOLN: 0.2500 mg | Freq: Once | INTRAMUSCULAR | Status: DC | PRN

## 2019-11-13 MED ORDER — EPHEDRINE 5 MG/ML INJ: 10.0000 mg | INTRAVENOUS | Status: DC | PRN

## 2019-11-13 MED ORDER — DIPHENHYDRAMINE HCL 50 MG/ML IJ SOLN: 12.5000 mg | INTRAMUSCULAR | Status: DC | PRN

## 2019-11-13 MED ORDER — SODIUM CHLORIDE 0.9 % IV SOLN
INTRAVENOUS | Status: DC | PRN
  Administered 2019-11-13 (×2): 5 mL via EPIDURAL

## 2019-11-13 MED ORDER — MISOPROSTOL 100 MCG PO TABS
25.0000 ug | ORAL_TABLET | ORAL | Status: DC | PRN
  Administered 2019-11-13: 25 ug via VAGINAL
  Filled 2019-11-13 (×3): qty 1

## 2019-11-13 MED ORDER — OXYTOCIN 10 UNIT/ML IJ SOLN
INTRAMUSCULAR | Status: AC
  Filled 2019-11-13: qty 2

## 2019-11-13 MED ORDER — MISOPROSTOL 200 MCG PO TABS
ORAL_TABLET | ORAL | Status: AC
  Filled 2019-11-13: qty 4

## 2019-11-13 MED ORDER — LACTATED RINGERS IV SOLN: 500.0000 mL | Freq: Once | INTRAVENOUS | Status: DC

## 2019-11-13 MED ORDER — BUTORPHANOL TARTRATE 1 MG/ML IJ SOLN
1.0000 mg | INTRAMUSCULAR | Status: DC | PRN
  Administered 2019-11-13: 1 mg via INTRAVENOUS
  Filled 2019-11-13: qty 1

## 2019-11-13 NOTE — Progress Notes (Signed)
   Subjective:  Feeling pressure  Objective:   Vitals: Blood pressure 135/82, pulse 83, temperature 98.2 F (36.8 C), temperature source Oral, resp. rate 18, height 5\' 8"  (1.727 m), weight 73.9 kg, last menstrual period 02/11/2019, SpO2 100 %. General: NAD Abdomen: gravid, non-tender Cervical Exam:  Dilation: 9 Effacement (%): 80 Cervical Position: Middle Station: Plus 1 Presentation: Vertex Exam by:: Kaidin Boehle MD  FHT: 135, moderate, +accels, occasional early Toco: q2-46min  Results for orders placed or performed during the hospital encounter of 11/13/19 (from the past 24 hour(s))  CBC     Status: None   Collection Time: 11/13/19  9:09 AM  Result Value Ref Range   WBC 10.1 4.0 - 10.5 K/uL   RBC 4.12 3.87 - 5.11 MIL/uL   Hemoglobin 12.0 12.0 - 15.0 g/dL   HCT 11/15/19 40.9 - 73.5 %   MCV 89.1 80.0 - 100.0 fL   MCH 29.1 26.0 - 34.0 pg   MCHC 32.7 30.0 - 36.0 g/dL   RDW 32.9 92.4 - 26.8 %   Platelets 279 150 - 400 K/uL   nRBC 0.0 0.0 - 0.2 %  Type and screen [facility]     Status: None   Collection Time: 11/13/19  9:09 AM  Result Value Ref Range   ABO/RH(D) A POS    Antibody Screen NEG    Sample Expiration      11/16/2019,2359 Performed at [facility], [address], [place], [place]   ABO/Rh     Status: None   Collection Time: 11/13/19 10:43 AM  Result Value Ref Range   ABO/RH(D)      A POS Performed at [facility], [address] [place], [place]     Assessment:   24 y.o. G1P0000 [redacted]w[redacted]d elective IOL  Plan:   1) Labor - continue pitocin  2) Fetus - cat I tracing  [redacted]w[redacted]d, MD, Vena Austria OB/GYN, [facility] 11/13/2019, 11:35 PM

## 2019-11-13 NOTE — Progress Notes (Signed)
Pt presents to L&D for scheduled elective IOL. Pt denies leaking of fluid or vaginal bleeding and states positive fetal movement. J.Gledhill,CNM aware of pt and orders placed.

## 2019-11-13 NOTE — Progress Notes (Signed)
   Subjective:  Comfortable, feeling contractions but not painful  Objective:   Vitals: Blood pressure 111/68, pulse 77, temperature 98.5 F (36.9 C), temperature source Oral, resp. rate 18, height 5\' 8"  (1.727 m), weight 73.9 kg, last menstrual period 02/11/2019. General: NAD Abdomen: gravid, soft, non-tender Cervical Exam:  Dilation: 3 Effacement (%): 70 Cervical Position: Middle Station: -2 Presentation: Vertex Exam by:: A.Staphanie Harbison,MD AROM clear  FHT: 130, moderate, +accels, no decels Toco: q2-3min  Results for orders placed or performed during the hospital encounter of 11/13/19 (from the past 24 hour(s))  CBC     Status: None   Collection Time: 11/13/19  9:09 AM  Result Value Ref Range   WBC 10.1 4.0 - 10.5 K/uL   RBC 4.12 3.87 - 5.11 MIL/uL   Hemoglobin 12.0 12.0 - 15.0 g/dL   HCT 11/15/19 97.6 - 73.4 %   MCV 89.1 80.0 - 100.0 fL   MCH 29.1 26.0 - 34.0 pg   MCHC 32.7 30.0 - 36.0 g/dL   RDW 19.3 79.0 - 24.0 %   Platelets 279 150 - 400 K/uL   nRBC 0.0 0.0 - 0.2 %  Type and screen [facility]     Status: None   Collection Time: 11/13/19  9:09 AM  Result Value Ref Range   ABO/RH(D) A POS    Antibody Screen NEG    Sample Expiration      11/16/2019,2359 Performed at [facility], [address] [place], [place]   ABO/Rh     Status: None   Collection Time: 11/13/19 10:43 AM  Result Value Ref Range   ABO/RH(D)      A POS Performed at [facility], [address], [place], [place]     Assessment:   24 y.o. G1P0000 [redacted]w[redacted]d elective IOL  Plan:   1) Labor - continue pitocin, AROM performed clear fluid  2) Fetus - Cat I tracing  [redacted]w[redacted]d, MD, Vena Austria OB/GYN, [facility] 11/13/2019, 6:05 PM

## 2019-11-13 NOTE — Progress Notes (Signed)
  Labor Progress Note   24 y.o. G1P0000 @ [redacted]w[redacted]d , admitted for  Pregnancy, Labor Management.   Subjective:  Still comfortable- not feeling contractions  Objective:  BP 113/73 (BP Location: Right Arm)   Pulse 78   Temp 98.2 F (36.8 C) (Oral)   Resp 18   Ht 5\' 8"  (1.727 m)   Wt 73.9 kg   LMP 02/11/2019 (Exact Date)   BMI 24.78 kg/m  Abd: gravid, ND, FHT present, mild tenderness on exam Extr: no edema SVE: CERVIX: 2 cm dilated, 60 effaced, -2 station Cervical sweep  EFM: FHR: 135 bpm, variability: moderate,  accelerations:  Present,  decelerations:  Absent Toco: Frequency: Every 1-4 minutes Labs: I have reviewed the patient's lab results.   Assessment & Plan:  G1P0000 @ 106w2d, admitted for  Pregnancy and Labor/Delivery Management  1. Pain management: none. 2. FWB: FHT category I.  3. ID: GBS negative 4. Labor management: start and titrate pitocin  All discussed with patient, see orders   [redacted]w[redacted]d, CNM [facility] 11/13/2019  1:34 PM

## 2019-11-13 NOTE — Anesthesia Preprocedure Evaluation (Signed)
Anesthesia Evaluation  Patient identified by MRN, date of birth, ID band Patient awake    Reviewed: Allergy & Precautions, H&P , NPO status , Patient's Chart, lab work & pertinent test results  History of Anesthesia Complications Negative for: history of anesthetic complications  Airway Mallampati: III  TM Distance: >3 FB Neck ROM: full    Dental  (+) Chipped   Pulmonary neg pulmonary ROS, former smoker,           Cardiovascular Exercise Tolerance: Good (-) hypertensionnegative cardio ROS       Neuro/Psych    GI/Hepatic negative GI ROS,   Endo/Other    Renal/GU   negative genitourinary   Musculoskeletal   Abdominal   Peds  Hematology negative hematology ROS (+)   Anesthesia Other Findings History reviewed. No pertinent past medical history.  Past Surgical History: No date: No past surgery  BMI    Body Mass Index: 24.78 kg/m      Reproductive/Obstetrics (+) Pregnancy                             Anesthesia Physical Anesthesia Plan  ASA: II  Anesthesia Plan: Epidural   Post-op Pain Management:    Induction:   PONV Risk Score and Plan:   Airway Management Planned: Natural Airway  Additional Equipment:   Intra-op Plan:   Post-operative Plan:   Informed Consent: I have reviewed the patients History and Physical, chart, labs and discussed the procedure including the risks, benefits and alternatives for the proposed anesthesia with the patient or authorized representative who has indicated his/her understanding and acceptance.     Dental Advisory Given  Plan Discussed with: Anesthesiologist  Anesthesia Plan Comments: (Patient reports no bleeding problems and no anticoagulant use.   Patient consented for risks of anesthesia including but not limited to:  - adverse reactions to medications - risk of bleeding, infection, nerve damage and headache - risk of failed  epidural - Damage to heart, brain, lungs or loss of life  Patient voiced understanding.)        Anesthesia Quick Evaluation

## 2019-11-13 NOTE — Anesthesia Procedure Notes (Signed)
Epidural Patient location during procedure: OB Start time: 11/13/2019 8:39 PM End time: 11/13/2019 8:42 PM  Staffing Anesthesiologist: Kye Silverstein, Cleda Mccreedy, MD Performed: anesthesiologist   Preanesthetic Checklist Completed: patient identified, IV checked, site marked, risks and benefits discussed, surgical consent, monitors and equipment checked, pre-op evaluation and timeout performed  Epidural Patient position: sitting Prep: ChloraPrep Patient monitoring: heart rate, continuous pulse ox and blood pressure Approach: midline Location: L3-L4 Injection technique: LOR saline  Needle:  Needle type: Tuohy  Needle gauge: 17 G Needle length: 9 cm and 9 Needle insertion depth: 4.5 cm Catheter type: closed end flexible Catheter size: 19 Gauge Catheter at skin depth: 9.5 cm Test dose: negative and 1.5% lidocaine with Epi 1:200 K  Assessment Sensory level: T10 Events: blood not aspirated, injection not painful, no injection resistance, no paresthesia and negative IV test  Additional Notes 1 attempt Pt. Evaluated and documentation done after procedure finished. Patient identified. Risks/Benefits/Options discussed with patient including but not limited to bleeding, infection, nerve damage, paralysis, failed block, incomplete pain control, headache, blood pressure changes, nausea, vomiting, reactions to medication both or allergic, itching and postpartum back pain. Confirmed with bedside nurse the patient's most recent platelet count. Confirmed with patient that they are not currently taking any anticoagulation, have any bleeding history or any family history of bleeding disorders. Patient expressed understanding and wished to proceed. All questions were answered. Sterile technique was used throughout the entire procedure. Please see nursing notes for vital signs. Test dose was given through epidural catheter and negative prior to continuing to dose epidural or start infusion. Warning signs of  high block given to the patient including shortness of breath, tingling/numbness in hands, complete motor block, or any concerning symptoms with instructions to call for help. Patient was given instructions on fall risk and not to get out of bed. All questions and concerns addressed with instructions to call with any issues or inadequate analgesia.   Patient tolerated the insertion well without immediate complications.Reason for block:procedure for pain

## 2019-11-13 NOTE — H&P (Signed)
History and Physical Interval Note:  11/13/2019 9:55 AM  Sonya Shannon  has presented today for INDUCTION OF LABOR (cervical ripening agents),  with the diagnosis of elective induction. The various methods of treatment have been discussed with the patient and family. After consideration of risks, benefits and other options for treatment, the patient has consented to  Labor induction .  The patient's history has been reviewed, patient examined, no change in status, and is stable for induction as planned.  See H&P. I have reviewed the patient's chart and labs.  Questions were answered to the patient's satisfaction.    Vital Signs: BP 120/81 (BP Location: Left Arm)   Pulse 90   Temp 98.7 F (37.1 C) (Oral)   Resp 18   Ht 5\' 8"  (1.727 m)   Wt 73.9 kg   LMP 02/11/2019 (Exact Date)   BMI 24.78 kg/m  Constitutional: Well nourished, well developed female in no acute distress.  HEENT: normal Skin: Warm and dry.  Cardiovascular: Regular rate and rhythm.   Extremity: no edema  Respiratory: Clear to auscultation bilateral. Normal respiratory effort Abdomen: FHT present Back: no CVAT Neuro: DTRs 2+, Cranial nerves grossly intact Psych: Alert and Oriented x3. No memory deficits. Normal mood and affect.  MS: normal gait, normal bilateral lower extremity ROM/strength/stability.  Pelvic exam: (female chaperone present) is not limited by body habitus EGBUS: within normal limits Vagina: within normal limits and with normal mucosa  Cervix: 1/50/-2 First dose cytotec placed/25 mcg each vaginal and buccal  Toco: occasional contraction Fetal well being: 140 bpm baseline, moderate variability, +accelerations, -decelerations   08-07-1997, CNM [facility] 11/13/2019  9:55 AM

## 2019-11-14 ENCOUNTER — Encounter: Payer: Self-pay | Admitting: Advanced Practice Midwife

## 2019-11-14 LAB — RPR: RPR Ser Ql: NONREACTIVE

## 2019-11-14 MED ORDER — OXYCODONE-ACETAMINOPHEN 5-325 MG PO TABS: 1.0000 | ORAL_TABLET | ORAL | Status: DC | PRN

## 2019-11-14 MED ORDER — BENZOCAINE-MENTHOL 20-0.5 % EX AERO
1.0000 "application " | INHALATION_SPRAY | CUTANEOUS | Status: DC | PRN
  Administered 2019-11-14: 1 via TOPICAL
  Filled 2019-11-14: qty 56

## 2019-11-14 MED ORDER — OXYCODONE-ACETAMINOPHEN 5-325 MG PO TABS: 2.0000 | ORAL_TABLET | ORAL | Status: DC | PRN

## 2019-11-14 MED ORDER — ACETAMINOPHEN 325 MG PO TABS: 650.0000 mg | ORAL_TABLET | ORAL | Status: DC | PRN

## 2019-11-14 MED ORDER — DIPHENHYDRAMINE HCL 25 MG PO CAPS: 25.0000 mg | ORAL_CAPSULE | Freq: Four times a day (QID) | ORAL | Status: DC | PRN

## 2019-11-14 MED ORDER — DIBUCAINE (PERIANAL) 1 % EX OINT: 1.0000 "application " | TOPICAL_OINTMENT | CUTANEOUS | Status: DC | PRN

## 2019-11-14 MED ORDER — SENNOSIDES-DOCUSATE SODIUM 8.6-50 MG PO TABS
2.0000 | ORAL_TABLET | ORAL | Status: DC
  Administered 2019-11-14: 2 via ORAL
  Filled 2019-11-14: qty 2

## 2019-11-14 MED ORDER — ONDANSETRON HCL 4 MG PO TABS: 4.0000 mg | ORAL_TABLET | ORAL | Status: DC | PRN

## 2019-11-14 MED ORDER — ONDANSETRON HCL 4 MG/2ML IJ SOLN: 4.0000 mg | INTRAMUSCULAR | Status: DC | PRN

## 2019-11-14 MED ORDER — SIMETHICONE 80 MG PO CHEW: 80.0000 mg | CHEWABLE_TABLET | ORAL | Status: DC | PRN

## 2019-11-14 MED ORDER — IBUPROFEN 600 MG PO TABS
600.0000 mg | ORAL_TABLET | Freq: Four times a day (QID) | ORAL | Status: DC
  Administered 2019-11-14 – 2019-11-15 (×5): 600 mg via ORAL
  Filled 2019-11-14 (×5): qty 1

## 2019-11-14 MED ORDER — WITCH HAZEL-GLYCERIN EX PADS: 1.0000 "application " | MEDICATED_PAD | CUTANEOUS | Status: DC | PRN

## 2019-11-14 MED ORDER — PRENATAL MULTIVITAMIN CH
1.0000 | ORAL_TABLET | Freq: Every day | ORAL | Status: DC
  Administered 2019-11-14: 1 via ORAL
  Filled 2019-11-14: qty 1

## 2019-11-14 MED ORDER — COCONUT OIL OIL: 1.0000 "application " | TOPICAL_OIL | Status: DC | PRN

## 2019-11-14 NOTE — Discharge Summary (Signed)
Obstetric Discharge Summary Reason for Admission: induction of labor Prenatal Procedures: none Intrapartum Procedures: spontaneous vaginal delivery Postpartum Procedures: none Complications-Operative and Postpartum: none Hemoglobin  Date Value Ref Range Status  11/15/2019 9.5 (L) 12.0 - 15.0 g/dL Final  51/70/0174 94.4 11.1 - 15.9 g/dL Final   HCT  Date Value Ref Range Status  11/15/2019 29.1 (L) 36.0 - 46.0 % Final   Hematocrit  Date Value Ref Range Status  08/24/2019 35.6 34.0 - 46.6 % Final    Physical Exam:  General: alert, cooperative and no distress Lochia: appropriate Uterine Fundus: firm/ U-1/ML/NT Perineum: healing well, no dehiscence, no significant erythema DVT Evaluation: No evidence of DVT seen on physical exam.  Discharge Diagnoses: Term Pregnancy-delivered  Discharge Information: Date: 11/15/2019 Activity: pelvic rest Diet: routine Allergies as of 11/15/2019   No Known Allergies     Medication List    TAKE these medications   acetaminophen 325 MG tablet Commonly known as: Tylenol Take 2 tablets (650 mg total) by mouth every 4 (four) hours as needed for mild pain or moderate pain (for pain scale < 4).   ferrous sulfate 325 (65 FE) MG tablet Commonly known as: FerrouSul Take 1 tablet (325 mg total) by mouth daily.   ibuprofen 600 MG tablet Commonly known as: ADVIL Take 1 tablet (600 mg total) by mouth every 6 (six) hours as needed for moderate pain or cramping.   Prenatal Vitamins 28-0.8 MG Tabs Take 1 tablet by mouth daily.       Condition: stable Discharge to: home Follow-up Information    Vena Austria, MD Follow up in 6 week(s).   Specialty: Obstetrics and Gynecology Why: postpartum visit Contact information: [address] [place] [address] [phone]           Newborn Data: Live born female Sonya Shannon Birth Weight:  7#6.5oz APGAR: 8, 9  Newborn Delivery   Birth date/time: 11/14/2019 04:22:00 Delivery type:  Vaginal, Spontaneous      Home with mother.  Sonya Shannon 11/15/2019, 10:50 AM

## 2019-11-14 NOTE — Lactation Note (Signed)
This note was copied from a baby's chart. Lactation Consultation Note  Patient Name: Sonya Shannon [id number] Today's Date: 11/14/2019   Mom breast feeding well with minimal assistance.  Sonya Shannon latches well with strong, rhythmic suck and occasional swallow.  Mom denies any breast or nipple pain.  Demonstrated hand expression.  Mom putting Sonya Shannon to the breast whenever she demonstrates feeding cues.  Reviewed normal newborn stomach size, supply and demand, normal course of lactation and routine newborn feeding patterns.  Hand out given on community lactation resources with contact numbers and reviewed.  Lactation name and number written on white board and encouraged to call with any questions, concerns or assistance.  Maternal Data    Feeding Feeding Type: Breast Fed  LATCH Score                   Interventions    Lactation Tools Discussed/Used     Consult Status      Louis Meckel 11/14/2019, 3:01 PM

## 2019-11-14 NOTE — Progress Notes (Signed)
   Subjective:  Comfortable with epidural  Objective:   Vitals: Blood pressure 118/81, pulse 84, temperature 98.2 F (36.8 C), temperature source Oral, resp. rate 18, height 5\' 8"  (1.727 m), weight 73.9 kg, last menstrual period 02/11/2019, SpO2 100 %. General: NAD Abdomen: gravid, non-tender Cervical Exam:  Dilation: Lip/rim Effacement (%): 100 Cervical Position: Middle Station: Plus 2 Presentation: Vertex Exam by:: Josue Kass MD  FHT: 130, moderate, +accels, no decels Toco: q58min  Results for orders placed or performed during the hospital encounter of 11/13/19 (from the past 24 hour(s))  CBC     Status: None   Collection Time: 11/13/19  9:09 AM  Result Value Ref Range   WBC 10.1 4.0 - 10.5 K/uL   RBC 4.12 3.87 - 5.11 MIL/uL   Hemoglobin 12.0 12.0 - 15.0 g/dL   HCT 11/15/19 66.4 - 40.3 %   MCV 89.1 80.0 - 100.0 fL   MCH 29.1 26.0 - 34.0 pg   MCHC 32.7 30.0 - 36.0 g/dL   RDW 47.4 25.9 - 56.3 %   Platelets 279 150 - 400 K/uL   nRBC 0.0 0.0 - 0.2 %  Type and screen [facility]     Status: None   Collection Time: 11/13/19  9:09 AM  Result Value Ref Range   ABO/RH(D) A POS    Antibody Screen NEG    Sample Expiration      11/16/2019,2359 Performed at [facility], [address], [place], [place]   ABO/Rh     Status: None   Collection Time: 11/13/19 10:43 AM  Result Value Ref Range   ABO/RH(D)      A POS Performed at [facility], [address], [place], [place]     Assessment:   24 y.o. G1P0000 [redacted]w[redacted]d   Plan:   1) Labor - continue pitocin, minimal anterior lip.  Discussed trying to reduce lip vs allowing to labor down.  Patient opts for laboring down.  Will recheck in 1-hr  2) Fetus - cat I tracing  [redacted]w[redacted]d, MD, [facility], [facility] 11/14/2019, 1:42 AM

## 2019-11-15 DIAGNOSIS — Z3490 Encounter for supervision of normal pregnancy, unspecified, unspecified trimester: Secondary | ICD-10-CM | POA: Diagnosis present

## 2019-11-15 LAB — CBC
HCT: 29.1 % — ABNORMAL LOW (ref 36.0–46.0)
Hemoglobin: 9.5 g/dL — ABNORMAL LOW (ref 12.0–15.0)
MCH: 29.6 pg (ref 26.0–34.0)
MCHC: 32.6 g/dL (ref 30.0–36.0)
MCV: 90.7 fL (ref 80.0–100.0)
Platelets: 233 10*3/uL (ref 150–400)
RBC: 3.21 MIL/uL — ABNORMAL LOW (ref 3.87–5.11)
RDW: 13.7 % (ref 11.5–15.5)
WBC: 12.7 10*3/uL — ABNORMAL HIGH (ref 4.0–10.5)
nRBC: 0 % (ref 0.0–0.2)

## 2019-11-15 MED ORDER — FERROUS SULFATE 325 (65 FE) MG PO TABS: 325.0000 mg | ORAL_TABLET | Freq: Every day | ORAL | 1 refills | Status: DC

## 2019-11-15 MED ORDER — IBUPROFEN 600 MG PO TABS: 600.0000 mg | ORAL_TABLET | Freq: Four times a day (QID) | ORAL | 0 refills | Status: DC | PRN

## 2019-11-15 MED ORDER — ACETAMINOPHEN 325 MG PO TABS: 650.0000 mg | ORAL_TABLET | ORAL | Status: DC | PRN

## 2019-11-15 NOTE — Progress Notes (Signed)
Discharge instructions complete and prescriptions sent to the pharmacy by the provider. Patient verbalizes understanding of teaching. Patient discharged home via wheelchair at 12.

## 2019-11-15 NOTE — Anesthesia Postprocedure Evaluation (Signed)
Anesthesia Post Note  Patient: Sonya Shannon  Procedure(s) Performed: AN AD HOC LABOR EPIDURAL  Patient location during evaluation: Other Anesthesia Type: Epidural Level of consciousness: awake and alert and oriented Pain management: pain level controlled Vital Signs Assessment: post-procedure vital signs reviewed and stable Respiratory status: spontaneous breathing Cardiovascular status: blood pressure returned to baseline Anesthetic complications: no Comments: Patient discharged prior to visit , but no apparent anesthetic issues prior to discharge according to staff.     Last Vitals:  Vitals:   11/14/19 2305 11/15/19 0849  BP: 110/75 103/77  Pulse: 88 72  Resp: 18 20  Temp: 36.9 C 36.7 C  SpO2: 99% 99%    Last Pain:  Vitals:   11/15/19 0849  TempSrc: Oral  PainSc:                  Nilay Mangrum

## 2019-11-15 NOTE — Discharge Instructions (Signed)
°Vaginal Delivery, Care After °Refer to this sheet in the next few weeks. These discharge instructions provide you with information on caring for yourself after delivery. Your caregiver may also give you specific instructions. Your treatment has been planned according to the most current medical practices available, but problems sometimes occur. Call your caregiver if you have any problems or questions after you go home. °HOME CARE INSTRUCTIONS °1. Take over-the-counter or prescription medicines only as directed by your caregiver or pharmacist. °2. Do not drink alcohol, especially if you are breastfeeding or taking medicine to relieve pain. °3. Do not smoke tobacco. °4. Continue to use good perineal care. Good perineal care includes: °1. Wiping your perineum from back to front °2. Keeping your perineum clean. °3. You can do sitz baths twice a day, to help keep this area clean °5. Do not use tampons, douche or have sex for 6 weeks °6. Shower only and avoid sitting in submerged water, aside from sitz baths °7. Wear a well-fitting bra that provides breast support. °8. Eat healthy foods. °9. Drink enough fluids to keep your urine clear or pale yellow. °10. Eat high-fiber foods such as whole grain cereals and breads, brown rice, beans, and fresh fruits and vegetables every day. These foods may help prevent or relieve constipation. °11. Avoid constipation with high fiber foods or medications, such as miralax or metamucil °12. Follow your caregiver's recommendations regarding resumption of activities such as climbing stairs, driving, lifting, exercising, or traveling. °13. Talk to your caregiver about resuming sexual activities. Resumption of sexual activities after 6 weeks is dependent upon your risk of infection, your rate of healing, and your comfort and desire to resume sexual activity. °14. Try to have someone help you with your household activities and your newborn for at least a few days after you leave the  hospital. °15. Rest as much as possible. Try to rest or take a nap when your newborn is sleeping. °16. Increase your activities gradually. °17. Keep all of your scheduled postpartum appointments. It is very important to keep your scheduled follow-up appointments. At these appointments, your caregiver will be checking to make sure that you are healing physically and emotionally. °SEEK MEDICAL CARE IF:  °· You are passing large clots from your vagina. Save any clots to show your caregiver. °· You have a foul smelling discharge from your vagina. °· You have trouble urinating. °· You are urinating frequently. °· You have pain when you urinate. °· You have a change in your bowel movements. °· You have increasing redness, pain, or swelling near your vaginal incision (episiotomy) or vaginal tear. °· You have pus draining from your episiotomy or vaginal tear. °· Your episiotomy or vaginal tear is separating. °· You have painful, hard, or reddened breasts. °· You have a severe headache. °· You have blurred vision or see spots. °· You feel sad or depressed. °· You have thoughts of hurting yourself or your newborn. °· You have questions about your care, the care of your newborn, or medicines. °· You are dizzy or light-headed. °· You have a rash. °· You have nausea or vomiting. °· You were breastfeeding and have not had a menstrual period within 12 weeks after you stopped breastfeeding. °· You are not breastfeeding and have not had a menstrual period by the 12th week after delivery. °· You have a fever of 100.5 or more °SEEK IMMEDIATE MEDICAL CARE IF:  °· You have persistent pain. °· You have chest pain. °· You have shortness   of breath. °· You faint. °· You have leg pain. °· You have stomach pain. °· Your vaginal bleeding saturates two or more sanitary pads in 1 hour. °MAKE SURE YOU:  °· Understand these instructions. °· Will watch your condition. °· Will get help right away if you are not doing well or get worse. °Document  Released: 08/10/2000 Document Revised: 12/28/2013 Document Reviewed: 04/09/2012 °ExitCare® Patient Information ©2015 [employer]. This information is not intended to replace advice given to you by your health care provider. Make sure you discuss any questions you have with your health care provider. ° °Sitz Bath °A sitz bath is a warm water bath taken in the sitting position. The water covers only the hips and butt (buttocks). We recommend using one that fits in the toilet, to help with ease of use and cleanliness. It may be used for either healing or cleaning purposes. Sitz baths are also used to relieve pain, itching, or muscle tightening (spasms). The water may contain medicine. Moist heat will help you heal and relax.  °HOME CARE  °Take 3 to 4 sitz baths a day. °18. Fill the bathtub half-full with warm water. °19. Sit in the water and open the drain a little. °20. Turn on the warm water to keep the tub half-full. Keep the water running constantly. °21. Soak in the water for 15 to 20 minutes. °22. After the sitz bath, pat the affected area dry. °GET HELP RIGHT AWAY IF: °You get worse instead of better. Stop the sitz baths if you get worse. °MAKE SURE YOU: °· Understand these instructions. °· Will watch your condition. °· Will get help right away if you are not doing well or get worse. °Document Released: 09/20/2004 Document Revised: 05/07/2012 Document Reviewed: 12/11/2010 °ExitCare® Patient Information ©2015 [employer]. This information is not intended to replace advice given to you by your health care provider. Make sure you discuss any questions you have with your health care provider. ° ° °

## 2019-11-15 NOTE — Progress Notes (Signed)
Post Partum Day 1 Subjective: no complaints, up ad lib, voiding, tolerating PO and breast feeding going well.  Objective: Blood pressure 103/77, pulse 72, temperature 98 F (36.7 C), temperature source Oral, resp. rate 20, height 5\' 8"  (1.727 m), weight 73.9 kg, last menstrual period 02/11/2019, SpO2 99 %, unknown if currently breastfeeding.  Physical Exam:  General: alert, cooperative and no distress Lochia: appropriate Uterine Fundus: firm/ U-1/ML/NT Perineum: healing well, no dehiscence, no significant bruising DVT Evaluation: No evidence of DVT seen on physical exam.  Recent Labs    11/13/19 0909 11/15/19 0656  HGB 12.0 9.5*  HCT 36.7 29.1*  WBC 10.1 12.7*  PLT 279 233    Assessment/Plan: Stable PPD #1-desires to be discharged-see discharge instructions Breast Contraception: LAM A POS/ RI/ VI TDAP UTD    LOS: 2 days   11/17/19 11/15/2019, 10:38 AM

## 2020-01-07 ENCOUNTER — Ambulatory Visit (INDEPENDENT_AMBULATORY_CARE_PROVIDER_SITE_OTHER): Payer: 59 | Admitting: Obstetrics and Gynecology

## 2020-01-07 ENCOUNTER — Encounter: Payer: Self-pay | Admitting: Obstetrics and Gynecology

## 2020-01-07 ENCOUNTER — Other Ambulatory Visit: Payer: Self-pay

## 2020-01-07 DIAGNOSIS — O99345 Other mental disorders complicating the puerperium: Secondary | ICD-10-CM

## 2020-01-07 DIAGNOSIS — F53 Postpartum depression: Secondary | ICD-10-CM

## 2020-01-07 MED ORDER — ESCITALOPRAM OXALATE 10 MG PO TABS: 10.0000 mg | ORAL_TABLET | Freq: Every day | ORAL | 3 refills | Status: DC

## 2020-01-07 NOTE — Progress Notes (Signed)
Postpartum Visit  Chief Complaint:  Chief Complaint  Patient presents with  . Postpartum Care    History of Present Illness: Patient is a 24 y.o. G1P1001 presents for postpartum visit.  Date of delivery: 11/14/2019 Type of delivery: Vaginal delivery - Vacuum or forceps assisted  no Episiotomy No.  Laceration: no  Pregnancy or labor problems:  no Any problems since the delivery:  no  Newborn Details:  SINGLETON :  1. BabyGender female [place]). Birth weight: 7lbs 6.5oz Maternal Details:  Breast or formula feeding: plans to bottle feed Any bowel or bladder issues: No  Post partum depression/anxiety noted:  yes Edinburgh Post-Partum Depression Score:13 Date of last PAP: 03/25/2019  low-grade squamous intraepithelial neoplasia (LGSIL - encompassing HPV,mild dysplasia,CIN I)   Review of Systems: Review of Systems  Constitutional: Negative.   Gastrointestinal: Negative.   Genitourinary: Negative.   Psychiatric/Behavioral: Positive for depression. Negative for hallucinations, memory loss, substance abuse and suicidal ideas. The patient is nervous/anxious and has insomnia.     The following portions of the patient's history were reviewed and updated as appropriate: allergies, current medications, past family history, past medical history, past social history, past surgical history and problem list.  Past Medical History:  History reviewed. No pertinent past medical history.  Past Surgical History:  Past Surgical History:  Procedure Laterality Date  . No past surgery      Family History:  History reviewed. No pertinent family history.  Social History:  Social History   Socioeconomic History  . Marital status: Single    Spouse name: Not on file  . Number of children: Not on file  . Years of education: Not on file  . Highest education level: Not on file  Occupational History  . Occupation: [employer]   Tobacco Use  . Smoking status: Former Games developer  . Smokeless  tobacco: Never Used  Substance and Sexual Activity  . Alcohol use: Not Currently  . Drug use: Yes    Types: Marijuana    Comment: Stoped when found out pregnant  . Sexual activity: Never    Birth control/protection: None  Other Topics Concern  . Not on file  Social History Narrative  . Not on file   Social Determinants of Health   Financial Resource Strain:   . Difficulty of Paying Living Expenses:   Food Insecurity:   . Worried About Programme researcher, broadcasting/film/video in the Last Year:   . Barista in the Last Year:   Transportation Needs:   . Freight forwarder (Medical):   Marland Kitchen Lack of Transportation (Non-Medical):   Physical Activity:   . Days of Exercise per Week:   . Minutes of Exercise per Session:   Stress:   . Feeling of Stress :   Social Connections:   . Frequency of Communication with Friends and Family:   . Frequency of Social Gatherings with Friends and Family:   . Attends Religious Services:   . Active Member of Clubs or Organizations:   . Attends Banker Meetings:   Marland Kitchen Marital Status:   Intimate Partner Violence:   . Fear of Current or Ex-Partner:   . Emotionally Abused:   Marland Kitchen Physically Abused:   . Sexually Abused:     Allergies:  No Known Allergies  Medications: Prior to Admission medications   Medication Sig Start Date End Date Taking? Authorizing Provider  acetaminophen (TYLENOL) 325 MG tablet Take 2 tablets (650 mg total) by mouth every 4 (four)  hours as needed for mild pain or moderate pain (for pain scale < 4). Patient not taking: Reported on 01/07/2020 11/15/19   Dalia Heading, CNM  ferrous sulfate (FERROUSUL) 325 (65 FE) MG tablet Take 1 tablet (325 mg total) by mouth daily. Patient not taking: Reported on 01/07/2020 11/15/19   Dalia Heading, CNM  ibuprofen (ADVIL) 600 MG tablet Take 1 tablet (600 mg total) by mouth every 6 (six) hours as needed for moderate pain or cramping. Patient not taking: Reported on 01/07/2020 11/15/19    Dalia Heading, CNM  Prenatal Vit-Fe Fumarate-FA (PRENATAL VITAMINS) 28-0.8 MG TABS Take 1 tablet by mouth daily.    [provider]    Physical Exam Blood pressure 100/70, height 5\' 8"  (1.727 m), weight 148 lb (67.1 kg), last menstrual period 01/05/2020, unknown if currently breastfeeding.    General: NAD HEENT: normocephalic, anicteric Pulmonary: No increased work of breathing Abdomen: NABS, soft, non-tender, non-distended.  Umbilicus without lesions.  No hepatomegaly, splenomegaly or masses palpable. No evidence of hernia. Genitourinary:  External: Normal external female genitalia.  Normal urethral meatus, normal  Bartholin's and Skene's glands.    Vagina: Normal vaginal mucosa, no evidence of prolapse.    Cervix: Grossly normal in appearance, no bleeding  Uterus: Non-enlarged, mobile, normal contour.  No CMT  Adnexa: ovaries non-enlarged, no adnexal masses  Rectal: deferred Extremities: no edema, erythema, or tenderness Neurologic: Grossly intact Psychiatric: mood appropriate, affect full  Edinburgh Postnatal Depression Scale - 01/07/20 1716      Edinburgh Postnatal Depression Scale:  In the Past 7 Days   I have been able to laugh and see the funny side of things.  1    I have looked forward with enjoyment to things.  0    I have blamed myself unnecessarily when things went wrong.  2    I have been anxious or worried for no good reason.  3    I have felt scared or panicky for no good reason.  2    Things have been getting on top of me.  1    I have been so unhappy that I have had difficulty sleeping.  2    I have felt sad or miserable.  1    I have been so unhappy that I have been crying.  1    The thought of harming myself has occurred to me.  0    Edinburgh Postnatal Depression Scale Total  13      Assessment: 24 y.o. G1P1001 presenting for 6 week postpartum visit  Plan: Problem List Items Addressed This Visit    None    Visit Diagnoses    6 weeks  postpartum follow-up    -  Primary   Postpartum depression       Relevant Medications   escitalopram (LEXAPRO) 10 MG tablet        1) Contraception - Education given regarding options for contraception, as well as compatibility with breast feeding if applicable.  Patient plans on none for contraception.  2)  Pap - ASCCP guidelines and rational discussed.  ASCCP guidelines and rational discussed.  Patient opts for every 3 years screening interval - repeat LGSIL pap sometime after July 2021  3) Patient underwent screening for postpartum depression with no signs of depression  4) Return in about 3 months (around 04/08/2020) for 1 week medication follow up phone/video/in person, 3 months follow up pap.   Malachy Mood, MD, [facility], [facility] 01/07/2020,  2:37 PM

## 2020-02-23 ENCOUNTER — Other Ambulatory Visit: Payer: Self-pay | Admitting: Obstetrics and Gynecology

## 2020-02-23 ENCOUNTER — Other Ambulatory Visit: Payer: Self-pay

## 2020-02-23 DIAGNOSIS — Z3201 Encounter for pregnancy test, result positive: Secondary | ICD-10-CM

## 2020-02-23 DIAGNOSIS — O3680X0 Pregnancy with inconclusive fetal viability, not applicable or unspecified: Secondary | ICD-10-CM

## 2020-02-23 NOTE — Telephone Encounter (Signed)
Labs today and on Thursday order is in

## 2020-02-23 NOTE — Telephone Encounter (Signed)
Phone on file not accepting call at this time.

## 2020-02-24 LAB — BETA HCG QUANT (REF LAB): hCG Quant: 19 m[IU]/mL

## 2020-02-25 ENCOUNTER — Other Ambulatory Visit: Payer: Self-pay

## 2020-02-25 ENCOUNTER — Other Ambulatory Visit: Payer: No Typology Code available for payment source

## 2020-02-26 LAB — BETA HCG QUANT (REF LAB): hCG Quant: 5 m[IU]/mL

## 2020-05-23 ENCOUNTER — Emergency Department: Payer: No Typology Code available for payment source

## 2020-05-23 ENCOUNTER — Encounter: Payer: Self-pay | Admitting: *Deleted

## 2020-05-23 ENCOUNTER — Other Ambulatory Visit: Payer: Self-pay

## 2020-05-23 DIAGNOSIS — R109 Unspecified abdominal pain: Secondary | ICD-10-CM | POA: Insufficient documentation

## 2020-05-23 DIAGNOSIS — Z3A01 Less than 8 weeks gestation of pregnancy: Secondary | ICD-10-CM | POA: Insufficient documentation

## 2020-05-23 DIAGNOSIS — Z5321 Procedure and treatment not carried out due to patient leaving prior to being seen by health care provider: Secondary | ICD-10-CM | POA: Insufficient documentation

## 2020-05-23 DIAGNOSIS — O219 Vomiting of pregnancy, unspecified: Secondary | ICD-10-CM | POA: Diagnosis not present

## 2020-05-23 LAB — CBC
HCT: 36.2 % (ref 36.0–46.0)
Hemoglobin: 12.4 g/dL (ref 12.0–15.0)
MCH: 28.9 pg (ref 26.0–34.0)
MCHC: 34.3 g/dL (ref 30.0–36.0)
MCV: 84.4 fL (ref 80.0–100.0)
Platelets: 254 10*3/uL (ref 150–400)
RBC: 4.29 MIL/uL (ref 3.87–5.11)
RDW: 12.9 % (ref 11.5–15.5)
WBC: 9.4 10*3/uL (ref 4.0–10.5)
nRBC: 0 % (ref 0.0–0.2)

## 2020-05-23 LAB — COMPREHENSIVE METABOLIC PANEL
ALT: 11 U/L (ref 0–44)
AST: 15 U/L (ref 15–41)
Albumin: 5 g/dL (ref 3.5–5.0)
Alkaline Phosphatase: 71 U/L (ref 38–126)
Anion gap: 10 (ref 5–15)
BUN: 10 mg/dL (ref 6–20)
CO2: 20 mmol/L — ABNORMAL LOW (ref 22–32)
Calcium: 10.1 mg/dL (ref 8.9–10.3)
Chloride: 106 mmol/L (ref 98–111)
Creatinine, Ser: 0.53 mg/dL (ref 0.44–1.00)
GFR calc Af Amer: >60 mL/min (ref >60)
GFR calc non Af Amer: >60 mL/min (ref >60)
Glucose, Bld: 152 mg/dL — ABNORMAL HIGH (ref 70–99)
Potassium: 4.3 mmol/L (ref 3.5–5.1)
Sodium: 136 mmol/L (ref 135–145)
Total Bilirubin: 1.2 mg/dL (ref 0.3–1.2)
Total Protein: 8.9 g/dL — ABNORMAL HIGH (ref 6.5–8.1)

## 2020-05-23 LAB — URINALYSIS, COMPLETE (UACMP) WITH MICROSCOPIC
Bacteria, UA: NONE SEEN
Bilirubin Urine: NEGATIVE
Glucose, UA: NEGATIVE mg/dL
Hgb urine dipstick: NEGATIVE
Ketones, ur: 80 mg/dL — AB
Leukocytes,Ua: NEGATIVE
Nitrite: NEGATIVE
Protein, ur: 100 mg/dL — AB
Specific Gravity, Urine: 1.032 — ABNORMAL HIGH (ref 1.005–1.030)
pH: 5 (ref 5.0–8.0)

## 2020-05-23 LAB — LIPASE, BLOOD: Lipase: 21 U/L (ref 11–51)

## 2020-05-23 LAB — POCT PREGNANCY, URINE: Preg Test, Ur: POSITIVE — AB

## 2020-05-23 LAB — HCG, QUANTITATIVE, PREGNANCY: hCG, Beta Chain, Quant, S: 84201 m[IU]/mL — ABNORMAL HIGH (ref <5)

## 2020-05-23 MED ORDER — ONDANSETRON 4 MG PO TBDP
4.0000 mg | ORAL_TABLET | Freq: Once | ORAL | Status: AC | PRN
  Administered 2020-05-23: 4 mg via ORAL
  Filled 2020-05-23: qty 1

## 2020-05-23 NOTE — ED Triage Notes (Signed)
Pt ambulatory to triage.  Pt states vomiting for 1 week.  Pt is approx [redacted] weeks pregnant.  Pt has abd pain.  No vag bleeding.  No urinary sx.  Pt alert.   [id number]

## 2020-05-23 NOTE — ED Notes (Signed)
poct pregnancy Positive 

## 2020-05-24 ENCOUNTER — Telehealth: Payer: Self-pay | Admitting: Emergency Medicine

## 2020-05-24 ENCOUNTER — Emergency Department
Admission: EM | Admit: 2020-05-24 | Discharge: 2020-05-24 | Disposition: A | Discharge status: Home / Self Care | Payer: No Typology Code available for payment source | Attending: Emergency Medicine | Admitting: Emergency Medicine

## 2020-05-24 ENCOUNTER — Other Ambulatory Visit: Payer: Self-pay | Admitting: Obstetrics and Gynecology

## 2020-05-24 DIAGNOSIS — O219 Vomiting of pregnancy, unspecified: Secondary | ICD-10-CM

## 2020-05-24 DIAGNOSIS — Z3401 Encounter for supervision of normal first pregnancy, first trimester: Secondary | ICD-10-CM

## 2020-05-24 DIAGNOSIS — R1030 Lower abdominal pain, unspecified: Secondary | ICD-10-CM

## 2020-05-24 MED ORDER — PROMETHAZINE HCL 25 MG PO TABS: 25.0000 mg | ORAL_TABLET | Freq: Four times a day (QID) | ORAL | 2 refills | Status: DC | PRN

## 2020-05-24 NOTE — Telephone Encounter (Signed)
Sonya Shannon called me back.   She said she has been able to keep down some broth today.   I asked her to call her OB and have them review the results of the ED visit.  She agrees to call them today.

## 2020-05-24 NOTE — ED Notes (Signed)
No answer when called several times from lobby 

## 2020-05-24 NOTE — Telephone Encounter (Signed)
Called patient due to lwot to inquire about condition and follow up plans. No answer and no voicemai.

## 2020-05-25 ENCOUNTER — Other Ambulatory Visit: Payer: Self-pay | Admitting: Obstetrics and Gynecology

## 2020-05-25 DIAGNOSIS — Z3401 Encounter for supervision of normal first pregnancy, first trimester: Secondary | ICD-10-CM

## 2020-05-25 DIAGNOSIS — O219 Vomiting of pregnancy, unspecified: Secondary | ICD-10-CM

## 2020-05-25 MED ORDER — PROMETHAZINE HCL 25 MG RE SUPP: 25.0000 mg | Freq: Four times a day (QID) | RECTAL | 2 refills | Status: DC | PRN

## 2020-05-27 ENCOUNTER — Observation Stay
Admission: AD | Admit: 2020-05-27 | Discharge: 2020-05-28 | Disposition: A | Discharge status: Home / Self Care | Payer: No Typology Code available for payment source | Source: Ambulatory Visit | Attending: Obstetrics and Gynecology | Admitting: Obstetrics and Gynecology

## 2020-05-27 DIAGNOSIS — O210 Mild hyperemesis gravidarum: Secondary | ICD-10-CM | POA: Diagnosis present

## 2020-05-27 DIAGNOSIS — Z3A08 8 weeks gestation of pregnancy: Secondary | ICD-10-CM | POA: Diagnosis not present

## 2020-05-27 DIAGNOSIS — Z20822 Contact with and (suspected) exposure to covid-19: Secondary | ICD-10-CM | POA: Diagnosis not present

## 2020-05-27 DIAGNOSIS — K439 Ventral hernia without obstruction or gangrene: Secondary | ICD-10-CM

## 2020-05-27 HISTORY — DX: Other specified health status: Z78.9

## 2020-05-27 LAB — RESPIRATORY PANEL BY RT PCR (FLU A&B, COVID)
Influenza A by PCR: NEGATIVE
Influenza B by PCR: NEGATIVE
SARS Coronavirus 2 by RT PCR: NEGATIVE

## 2020-05-27 LAB — T4, FREE: Free T4: 1.5 ng/dL — ABNORMAL HIGH (ref 0.61–1.12)

## 2020-05-27 LAB — URINALYSIS, COMPLETE (UACMP) WITH MICROSCOPIC
Bacteria, UA: NONE SEEN
Bilirubin Urine: NEGATIVE
Glucose, UA: NEGATIVE mg/dL
Hgb urine dipstick: NEGATIVE
Ketones, ur: 80 mg/dL — AB
Leukocytes,Ua: NEGATIVE
Nitrite: NEGATIVE
Protein, ur: 100 mg/dL — AB
Specific Gravity, Urine: 1.027 (ref 1.005–1.030)
pH: 5 (ref 5.0–8.0)

## 2020-05-27 LAB — COMPREHENSIVE METABOLIC PANEL
ALT: 10 U/L (ref 0–44)
AST: 13 U/L — ABNORMAL LOW (ref 15–41)
Albumin: 5 g/dL (ref 3.5–5.0)
Alkaline Phosphatase: 71 U/L (ref 38–126)
Anion gap: 16 — ABNORMAL HIGH (ref 5–15)
BUN: 9 mg/dL (ref 6–20)
CO2: 15 mmol/L — ABNORMAL LOW (ref 22–32)
Calcium: 9.8 mg/dL (ref 8.9–10.3)
Chloride: 102 mmol/L (ref 98–111)
Creatinine, Ser: 0.49 mg/dL (ref 0.44–1.00)
GFR calc Af Amer: >60 mL/min (ref >60)
GFR calc non Af Amer: >60 mL/min (ref >60)
Glucose, Bld: 84 mg/dL (ref 70–99)
Potassium: 4.3 mmol/L (ref 3.5–5.1)
Sodium: 133 mmol/L — ABNORMAL LOW (ref 135–145)
Total Bilirubin: 1.7 mg/dL — ABNORMAL HIGH (ref 0.3–1.2)
Total Protein: 9.2 g/dL — ABNORMAL HIGH (ref 6.5–8.1)

## 2020-05-27 LAB — CBC
HCT: 38.5 % (ref 36.0–46.0)
Hemoglobin: 13.7 g/dL (ref 12.0–15.0)
MCH: 29.2 pg (ref 26.0–34.0)
MCHC: 35.6 g/dL (ref 30.0–36.0)
MCV: 82.1 fL (ref 80.0–100.0)
Platelets: 272 10*3/uL (ref 150–400)
RBC: 4.69 MIL/uL (ref 3.87–5.11)
RDW: 12.8 % (ref 11.5–15.5)
WBC: 10.3 10*3/uL (ref 4.0–10.5)
nRBC: 0 % (ref 0.0–0.2)

## 2020-05-27 LAB — MAGNESIUM: Magnesium: 1.9 mg/dL (ref 1.7–2.4)

## 2020-05-27 LAB — TSH: TSH: 0.679 u[IU]/mL (ref 0.350–4.500)

## 2020-05-27 LAB — TYPE AND SCREEN
ABO/RH(D): A POS
Antibody Screen: NEGATIVE

## 2020-05-27 MED ORDER — DOCUSATE SODIUM 100 MG PO CAPS
100.0000 mg | ORAL_CAPSULE | Freq: Every day | ORAL | Status: DC
  Administered 2020-05-27 – 2020-05-28 (×2): 100 mg via ORAL
  Filled 2020-05-27 (×2): qty 1

## 2020-05-27 MED ORDER — ACETAMINOPHEN 325 MG PO TABS: 650.0000 mg | ORAL_TABLET | ORAL | Status: DC | PRN

## 2020-05-27 MED ORDER — METOCLOPRAMIDE HCL 5 MG/ML IJ SOLN: 10.0000 mg | Freq: Four times a day (QID) | INTRAMUSCULAR | Status: DC | PRN

## 2020-05-27 MED ORDER — DEXTROSE IN LACTATED RINGERS 5 % IV SOLN: INTRAVENOUS | Status: DC

## 2020-05-27 MED ORDER — CALCIUM CARBONATE ANTACID 500 MG PO CHEW: 2.0000 | CHEWABLE_TABLET | ORAL | Status: DC | PRN

## 2020-05-27 MED ORDER — LACTATED RINGERS IV BOLUS
1000.0000 mL | Freq: Once | INTRAVENOUS | Status: AC
  Administered 2020-05-27: 1000 mL via INTRAVENOUS

## 2020-05-27 MED ORDER — PRENATAL MULTIVITAMIN CH
1.0000 | ORAL_TABLET | Freq: Every day | ORAL | Status: DC
  Administered 2020-05-28: 1 via ORAL
  Filled 2020-05-27: qty 1

## 2020-05-27 MED ORDER — ZOLPIDEM TARTRATE 5 MG PO TABS: 5.0000 mg | ORAL_TABLET | Freq: Every evening | ORAL | Status: DC | PRN

## 2020-05-27 MED ORDER — VITAMIN B-6 50 MG PO TABS
25.0000 mg | ORAL_TABLET | Freq: Three times a day (TID) | ORAL | Status: DC
  Administered 2020-05-27 – 2020-05-28 (×3): 25 mg via ORAL
  Filled 2020-05-27 (×5): qty 0.5

## 2020-05-27 MED ORDER — THIAMINE HCL 100 MG/ML IJ SOLN
Freq: Once | INTRAVENOUS | Status: AC
  Filled 2020-05-27: qty 10

## 2020-05-27 MED ORDER — PANTOPRAZOLE SODIUM 40 MG IV SOLR
40.0000 mg | Freq: Two times a day (BID) | INTRAVENOUS | Status: DC
  Administered 2020-05-27 – 2020-05-28 (×2): 40 mg via INTRAVENOUS
  Filled 2020-05-27 (×2): qty 40

## 2020-05-27 NOTE — H&P (Signed)
OBSTETRICS AND GYNECOLOGY HISTORY AND PHYSICAL NOTE  Ob/Gyn admission   Sonya Shannon [id number] 05/27/2020 8:26 PM    Chief Complaint Sonya Shannon is a 24 y.o. G2P1001 premenopausal female at [redacted]w[redacted]d admitted for unrelenting nausea and vomiting.  History of Present Ilness:   She presents with a history of unrelenting nausea and vomiting. She went to the ER on 9/28 and was given IV fluids and zofran. This worked for a little while. She then was unable to keep down fluid and solids.  She was given prescription for phenergan PO, which she was unable to keep down.  A prescription for phenergan PR was given. However, she was unable to afford this medication. Due to intractable nausea and vomiting along with weight loss, she was directly admitted for treatment. She had an ultrasound on 9/28 which confirmed her Estimated Date of Delivery: 01/03/21.  She notes no vaginal bleeding.  She has no other acute complaints.    Past Medical History:  Diagnosis Date  . No known health problems    Past Surgical History:  Procedure Laterality Date  . No past surgery     Allergies: No Known Allergies   Prior to Admission medications   Medication Sig Start Date End Date Taking? Authorizing Provider  escitalopram (LEXAPRO) 10 MG tablet Take 1 tablet (10 mg total) by mouth daily. 01/07/20   Vena Austria, MD  Prenatal Vit-Fe Fumarate-FA (PRENATAL VITAMINS) 28-0.8 MG TABS Take 1 tablet by mouth daily.    [provider]   Social History:  She  reports that she has quit smoking. She has never used smokeless tobacco. She reports previous alcohol use. She reports current drug use. Drug: Marijuana.  Family History:  Denies history of gynecologic and breast cancer.  Review of Systems:   Review of Systems  Constitutional: Negative.  Negative for chills and fever.  HENT: Negative.   Eyes: Negative.   Respiratory: Negative.   Cardiovascular: Negative.   Gastrointestinal: Positive for  abdominal pain, constipation, heartburn, nausea and vomiting. Negative for blood in stool, diarrhea and melena.       She notes small hematemesis  Genitourinary: Negative.   Musculoskeletal: Negative.   Skin: Negative.   Neurological: Negative.   Psychiatric/Behavioral: Negative.      Objective    BP 112/66 (BP Location: Right Arm)   Pulse 64   Temp 98.7 F (37.1 C) (Oral)   Resp 18   Ht 5\' 8"  (1.727 m)   Wt 53.8 kg   LMP 03/29/2020 (Exact Date)   SpO2 100%   BMI 18.03 kg/m  Physical Exam Constitutional:      General: She is not in acute distress.    Appearance: Normal appearance. She is well-developed.  HENT:     Head: Normocephalic and atraumatic.  Eyes:     General: No scleral icterus.    Conjunctiva/sclera: Conjunctivae normal.  Cardiovascular:     Rate and Rhythm: Normal rate and regular rhythm.     Heart sounds: No murmur heard.  No friction rub. No gallop.   Pulmonary:     Effort: Pulmonary effort is normal. No respiratory distress.     Breath sounds: Normal breath sounds. No wheezing or rales.  Abdominal:     General: Bowel sounds are normal. There is no distension.     Palpations: Abdomen is soft. There is no mass.     Tenderness: There is no abdominal tenderness. There is no guarding or rebound.  Musculoskeletal:  General: Normal range of motion.     Cervical back: Normal range of motion and neck supple.  Neurological:     General: No focal deficit present.     Mental Status: She is alert and oriented to person, place, and time.     Cranial Nerves: No cranial nerve deficit.  Skin:    General: Skin is warm and dry.     Findings: No erythema.  Psychiatric:        Mood and Affect: Mood normal.        Behavior: Behavior normal.        Judgment: Judgment normal.    Laboratory Results:   Lab Results  Component Value Date   WBC 10.3 05/27/2020   RBC 4.69 05/27/2020   HGB 13.7 05/27/2020   HCT 38.5 05/27/2020   PLT 272 05/27/2020   NA 133 (L)  05/27/2020   K 4.3 05/27/2020   CREATININE 0.49 05/27/2020   Lab Results  Component Value Date   PREGTESTUR POSITIVE (A) 05/23/2020    Imaging Results:  US OB LESS THAN 14 WEEKS WITH OB TRANSVAGINAL  Result Date: 05/24/2020 CLINICAL DATA:  Lower abdominal pain since this morning, emesis for 1 week, quantitative beta hCG 84,201 EXAM: OBSTETRIC <14 WK [place] AND TRANSVAGINAL OB US TECHNIQUE: Both transabdominal and transvaginal ultrasound examinations were performed for complete evaluation of the gestation as well as the maternal uterus, adnexal regions, and pelvic cul-de-sac. Transvaginal technique was performed to assess early pregnancy. COMPARISON:  Obstetrical ultrasound of prior gestation 10/19/2019 FINDINGS: Intrauterine gestational sac: Single Yolk sac:  Visualized. Embryo:  Visualized. Cardiac Activity: Visualized. Heart Rate: 164 bpm CRL:   12.3 mm   7 w 3 d                  [place] EDC: 01/06/2021 Subchorionic hemorrhage: Moderate volume subchorionic hemorrhage is noted measuring 2.5 x 1.1 x 0.8 cm Maternal uterus/adnexae: Anteverted maternal uterus without other acute abnormality. Corpus luteum in the right ovary. No concerning adnexal abnormalities. Trace free fluid in the pelvis is nonspecific and possibly physiologic. IMPRESSION: Single intrauterine gestation at a gestational age of [redacted] weeks 3 days by crown-rump length sonographic estimation. Moderate subchorionic hemorrhage. Consider obstetrical consultation for further management. No further sonographic complication. Small volume anechoic fluid in the pelvis, nonspecific though possibly physiologic. Electronically Signed   By: Kreg Shropshire M.D.   On: 05/24/2020 00:08     Assessment & Plan   Sonya Shannon is a 24 y.o. G2P1001 premenopausal female at [redacted]w[redacted]d with hyperemesis gravidarum.    1.  Admit for IVF hydration, NPO 2.  Check electrolytes and replete as needed, Thiamin 100 mg daily x 3 days. 3. Start Reglan for now with IV PPI 4.  Advance slowly and convert to PO antiemetic when able to tolerate 5. Dispo: pending clinical improvement.   Thomasene Mohair, MD 05/27/2020 8:26 PM

## 2020-05-28 DIAGNOSIS — O210 Mild hyperemesis gravidarum: Secondary | ICD-10-CM | POA: Diagnosis not present

## 2020-05-28 MED ORDER — SODIUM CHLORIDE 0.9% FLUSH: 3.0000 mL | INTRAVENOUS | Status: DC | PRN

## 2020-05-28 MED ORDER — ONDANSETRON 4 MG PO TBDP: 4.0000 mg | ORAL_TABLET | Freq: Four times a day (QID) | ORAL | 0 refills | Status: DC | PRN

## 2020-05-28 MED ORDER — SODIUM CHLORIDE 0.9% FLUSH: 3.0000 mL | Freq: Two times a day (BID) | INTRAVENOUS | Status: DC

## 2020-05-28 MED ORDER — SODIUM CHLORIDE 0.9 % IV SOLN: 250.0000 mL | INTRAVENOUS | Status: DC | PRN

## 2020-05-28 MED ORDER — ONDANSETRON 4 MG PO TBDP: 4.0000 mg | ORAL_TABLET | Freq: Four times a day (QID) | ORAL | Status: DC | PRN

## 2020-05-28 MED ORDER — METOCLOPRAMIDE HCL 10 MG PO TABS
10.0000 mg | ORAL_TABLET | Freq: Three times a day (TID) | ORAL | Status: DC
  Administered 2020-05-28: 10 mg via ORAL
  Filled 2020-05-28: qty 1

## 2020-05-28 MED ORDER — METOCLOPRAMIDE HCL 10 MG PO TABS
10.0000 mg | ORAL_TABLET | Freq: Three times a day (TID) | ORAL | 0 refills | Status: DC
Start: 2020-05-28 — End: 2021-01-10

## 2020-05-28 NOTE — Progress Notes (Signed)
Obstetric and Gynecology  Subjective  Nausea significantly improved.  Tolerated some fluids.  No emesis overnight  Objective  Vital signs in last 24 hours: Temp:  [98.3 F (36.8 C)-98.9 F (37.2 C)] 98.9 F (37.2 C) (10/02 0807) Pulse Rate:  [62-75] 62 (10/02 0807) Resp:  [16-18] 18 (10/02 0807) BP: (100-112)/(60-75) 100/73 (10/02 0807) SpO2:  [100 %] 100 % (10/02 0807) Weight:  [53.8 kg] 53.8 kg (10/01 1230)     Intake/Output Summary (Last 24 hours) at 05/28/2020 0956 Last data filed at 05/28/2020 7517 Gross per 24 hour  Intake 1204.48 ml  Output 600 ml  Net 604.48 ml    General: NAD Pulmonary: no increased work of breathing Abdomen: soft, non-tender Extremities: no edema  Labs: Results for orders placed or performed during the hospital encounter of 05/27/20 (from the past 24 hour(s))  Respiratory Panel by RT PCR (Flu A&B, Covid) - Nasopharyngeal Swab     Status: None   Collection Time: 05/27/20 12:02 PM   Specimen: Nasopharyngeal Swab  Result Value Ref Range   SARS Coronavirus 2 by RT PCR NEGATIVE NEGATIVE   Influenza A by PCR NEGATIVE NEGATIVE   Influenza B by PCR NEGATIVE NEGATIVE  Type and screen [facility]     Status: None   Collection Time: 05/27/20  1:14 PM  Result Value Ref Range   ABO/RH(D) A POS    Antibody Screen NEG    Sample Expiration      05/30/2020,2359 Performed at [facility], [address] [place], [place] [address]   Comprehensive metabolic panel     Status: Abnormal   Collection Time: 05/27/20  1:14 PM  Result Value Ref Range   Sodium 133 (L) 135 - 145 mmol/L   Potassium 4.3 3.5 - 5.1 mmol/L   Chloride 102 98 - 111 mmol/L   CO2 15 (L) 22 - 32 mmol/L   Glucose, Bld 84 70 - 99 mg/dL   BUN 9 6 - 20 mg/dL   Creatinine, Ser 9.44 0.44 - 1.00 mg/dL   Calcium 9.8 8.9 - 96.7 mg/dL   Total Protein 9.2 (H) 6.5 - 8.1 g/dL   Albumin 5.0 3.5 - 5.0 g/dL   AST 13 (L) 15 - 41 U/L   ALT 10 0 - 44 U/L   Alkaline  Phosphatase 71 38 - 126 U/L   Total Bilirubin 1.7 (H) 0.3 - 1.2 mg/dL   GFR calc non Af Amer >60 >60 mL/min   GFR calc Af Amer >60 >60 mL/min   Anion gap 16 (H) 5 - 15  Magnesium     Status: None   Collection Time: 05/27/20  1:14 PM  Result Value Ref Range   Magnesium 1.9 1.7 - 2.4 mg/dL  CBC on admission     Status: None   Collection Time: 05/27/20  1:14 PM  Result Value Ref Range   WBC 10.3 4.0 - 10.5 K/uL   RBC 4.69 3.87 - 5.11 MIL/uL   Hemoglobin 13.7 12.0 - 15.0 g/dL   HCT 59.1 36 - 46 %   MCV 82.1 80.0 - 100.0 fL   MCH 29.2 26.0 - 34.0 pg   MCHC 35.6 30.0 - 36.0 g/dL   RDW 63.8 46.6 - 59.9 %   Platelets 272 150 - 400 K/uL   nRBC 0.0 0.0 - 0.2 %  T4, free     Status: Abnormal   Collection Time: 05/27/20  1:14 PM  Result Value Ref Range   Free T4  1.50 (H) 0.61 - 1.12 ng/dL  TSH     Status: None   Collection Time: 05/27/20  1:14 PM  Result Value Ref Range   TSH 0.679 0.350 - 4.500 uIU/mL  Urinalysis, Complete w Microscopic Urine, Random     Status: Abnormal   Collection Time: 05/27/20  3:30 PM  Result Value Ref Range   Color, Urine YELLOW (A) YELLOW   APPearance HAZY (A) CLEAR   Specific Gravity, Urine 1.027 1.005 - 1.030   pH 5.0 5.0 - 8.0   Glucose, UA NEGATIVE NEGATIVE mg/dL   Hgb urine dipstick NEGATIVE NEGATIVE   Bilirubin Urine NEGATIVE NEGATIVE   Ketones, ur 80 (A) NEGATIVE mg/dL   Protein, ur 253 (A) NEGATIVE mg/dL   Nitrite NEGATIVE NEGATIVE   Leukocytes,Ua NEGATIVE NEGATIVE   RBC / HPF 0-5 0 - 5 RBC/hpf   WBC, UA 0-5 0 - 5 WBC/hpf   Bacteria, UA NONE SEEN NONE SEEN   Squamous Epithelial / LPF 6-10 0 - 5   Mucus PRESENT     Cultures: Results for orders placed or performed during the hospital encounter of 05/27/20  Respiratory Panel by RT PCR (Flu A&B, Covid) - Nasopharyngeal Swab     Status: None   Collection Time: 05/27/20 12:02 PM   Specimen: Nasopharyngeal Swab  Result Value Ref Range Status   SARS Coronavirus 2 by RT PCR NEGATIVE NEGATIVE Final     Comment: (NOTE) SARS-CoV-2 target nucleic acids are NOT DETECTED.  The SARS-CoV-2 RNA is generally detectable in upper respiratoy specimens during the acute phase of infection. The lowest concentration of SARS-CoV-2 viral copies this assay can detect is 131 copies/mL. A negative result does not preclude SARS-Cov-2 infection and should not be used as the sole basis for treatment or other patient management decisions. A negative result may occur with  improper specimen collection/handling, submission of specimen other than nasopharyngeal swab, presence of viral mutation(s) within the areas targeted by this assay, and inadequate number of viral copies (<131 copies/mL). A negative result must be combined with clinical observations, patient history, and epidemiological information. The expected result is Negative.  Fact Sheet for Patients:  https://www.moore.com/  Fact Sheet for Healthcare Providers:  https://www.young.biz/  This test is no t yet approved or cleared by the [place] FDA and  has been authorized for detection and/or diagnosis of SARS-CoV-2 by FDA under an Emergency Use Authorization (EUA). This EUA will remain  in effect (meaning this test can be used) for the duration of the COVID-19 declaration under Section 564(b)(1) of the Act, 21 U.S.C. section 360bbb-3(b)(1), unless the authorization is terminated or revoked sooner.     Influenza A by PCR NEGATIVE NEGATIVE Final   Influenza B by PCR NEGATIVE NEGATIVE Final    Comment: (NOTE) The Xpert Xpress SARS-CoV-2/FLU/RSV assay is intended as an aid in  the diagnosis of influenza from Nasopharyngeal swab specimens and  should not be used as a sole basis for treatment. Nasal washings and  aspirates are unacceptable for Xpert Xpress SARS-CoV-2/FLU/RSV  testing.  Fact Sheet for Patients: https://www.moore.com/  Fact Sheet for Healthcare  Providers: https://www.young.biz/  This test is not yet approved or cleared by the [place] FDA and  has been authorized for detection and/or diagnosis of SARS-CoV-2 by  FDA under an Emergency Use Authorization (EUA). This EUA will remain  in effect (meaning this test can be used) for the duration of the  Covid-19 declaration under Section 564(b)(1) of the Act, 21  U.S.C. section  360bbb-3(b)(1), unless the authorization is  terminated or revoked. Performed at [facility], [address] [place], [place] [address]     Imaging:  Assessment   24 y.o. G2P1001 HD2 hyperemesis gravidarum  Plan   1) Hyperemesis - saline lock - advance to full liquids - switch reglan to po, add prn Zofran ODT  2) Disposition - if demonstrated reasonable po tolerance of po regimen possible discharge later today

## 2020-05-28 NOTE — Discharge Instructions (Signed)
Hyperemesis Gravidarum Hyperemesis gravidarum is a severe form of nausea and vomiting that happens during pregnancy. Hyperemesis is worse than morning sickness. It may cause you to have nausea or vomiting all day for many days. It may keep you from eating and drinking enough food and liquids, which can lead to dehydration, malnutrition, and weight loss. Hyperemesis usually occurs during the first half (the first 20 weeks) of pregnancy. It often goes away once a woman is in her second half of pregnancy. However, sometimes hyperemesis continues through an entire pregnancy. What are the causes? The cause of this condition is not known. It may be related to changes in chemicals (hormones) in the body during pregnancy, such as the high level of pregnancy hormone (human chorionic gonadotropin) or the increase in the female sex hormone (estrogen). What are the signs or symptoms? Symptoms of this condition include:  Nausea that does not go away.  Vomiting that does not allow you to keep any food down.  Weight loss.  Body fluid loss (dehydration).  Having no desire to eat, or not liking food that you have previously enjoyed. How is this diagnosed? This condition may be diagnosed based on:  A physical exam.  Your medical history.  Your symptoms.  Blood tests.  Urine tests. How is this treated? This condition is managed by controlling symptoms. This may include:  Following an eating plan. This can help lessen nausea and vomiting.  Taking prescription medicines. An eating plan and medicines are often used together to help control symptoms. If medicines do not help relieve nausea and vomiting, you may need to receive fluids through an IV at the hospital. Follow these instructions at home: Eating and drinking   Avoid the following: ? Drinking fluids with meals. Try not to drink anything during the 30 minutes before and after your meals. ? Drinking more than 1 cup of fluid at a  time. ? Eating foods that trigger your symptoms. These may include spicy foods, coffee, high-fat foods, very sweet foods, and acidic foods. ? Skipping meals. Nausea can be more intense on an empty stomach. If you cannot tolerate food, do not force it. Try sucking on ice chips or other frozen items and make up for missed calories later. ? Lying down within 2 hours after eating. ? Being exposed to environmental triggers. These may include food smells, smoky rooms, closed spaces, rooms with strong smells, warm or humid places, overly loud and noisy rooms, and rooms with motion or flickering lights. Try eating meals in a well-ventilated area that is free of strong smells. ? Quick and sudden changes in your movement. ? Taking iron pills and multivitamins that contain iron. If you take prescription iron pills, do not stop taking them unless your health care provider approves. ? Preparing food. The smell of food can spoil your appetite or trigger nausea.  To help relieve your symptoms: ? Listen to your body. Everyone is different and has different preferences. Find what works best for you. ? Eat and drink slowly. ? Eat 5-6 small meals daily instead of 3 large meals. Eating small meals and snacks can help you avoid an empty stomach. ? In the morning, before getting out of bed, eat a couple of crackers to avoid moving around on an empty stomach. ? Try eating starchy foods as these are usually tolerated well. Examples include cereal, toast, bread, potatoes, pasta, rice, and pretzels. ? Include at least 1 serving of protein with your meals and snacks. Protein options include   lean meats, poultry, seafood, beans, nuts, nut butters, eggs, cheese, and yogurt. ? Try eating a protein-rich snack before bed. Examples of a protein-rick snack include cheese and crackers or a peanut butter sandwich made with 1 slice of whole-wheat bread and 1 tsp (5 g) of peanut butter. ? Eat or suck on things that have ginger in them.  It may help relieve nausea. Add  tsp ground ginger to hot tea or choose ginger tea. ? Try drinking 100% fruit juice or an electrolyte drink. An electrolyte drink contains sodium, potassium, and chloride. ? Drink fluids that are cold, clear, and carbonated or sour. Examples include lemonade, ginger ale, lemon-lime soda, ice water, and sparkling water. ? Brush your teeth or use a mouth rinse after meals. ? Talk with your health care provider about starting a supplement of vitamin B6. General instructions  Take over-the-counter and prescription medicines only as told by your health care provider.  Follow instructions from your health care provider about eating or drinking restrictions.  Continue to take your prenatal vitamins as told by your health care provider. If you are having trouble taking your prenatal vitamins, talk with your health care provider about different options.  Keep all follow-up and pre-birth (prenatal) visits as told by your health care provider. This is important. Contact a health care provider if:  You have pain in your abdomen.  You have a severe headache.  You have vision problems.  You are losing weight.  You feel weak or dizzy. Get help right away if:  You cannot drink fluids without vomiting.  You vomit blood.  You have constant nausea and vomiting.  You are very weak.  You faint.  You have a fever and your symptoms suddenly get worse. Summary  Hyperemesis gravidarum is a severe form of nausea and vomiting that happens during pregnancy.  Making some changes to your eating habits may help relieve nausea and vomiting.  This condition may be managed with medicine.  If medicines do not help relieve nausea and vomiting, you may need to receive fluids through an IV at the hospital. This information is not intended to replace advice given to you by your health care provider. Make sure you discuss any questions you have with your health care  provider. Document Revised: 09/02/2017 Document Reviewed: 04/11/2016 Elsevier Patient Education  2020 [employer] Inc.  

## 2020-05-28 NOTE — Progress Notes (Signed)
Discharge instructions, prescriptions, education, and appointments given and explained. Pt verbalized understanding with no further questions. Pt wheeled to personal vehicle for d/c 

## 2020-05-28 NOTE — Discharge Summary (Signed)
Physician Discharge Summary  Patient ID: Sonya Shannon MRN: [id number] DOB/AGE: Jun 26, 1996 24 y.o.  Admit date: 05/27/2020 Discharge date: 05/28/2020  Admission Diagnoses:  Discharge Diagnoses:  Active Problems:   Hyperemesis gravidarum   Discharged Condition: good  Hospital Course: 24 y.o. G2P1001 at [redacted]w[redacted]d presenting to the hospital with hyperemesis gravidarum unresponsive to home management.  On admission no electrolyte abnormalities but evidence of dehydration with ketones on UA.  Patient was admitted and started on IV hydration and reglan.  Good response.  Patient felt significantly better the next day and passes trial of saline lock, po medications, and bland diet.  Patient was discharged home on same antiemetic regimen and has follow up on Monday 05/30/2020 in clinic.  Consults: None  Significant Diagnostic Studies:  Results for orders placed or performed during the hospital encounter of 05/27/20 (from the past 24 hour(s))  Urinalysis, Complete w Microscopic Urine, Random     Status: Abnormal   Collection Time: 05/27/20  3:30 PM  Result Value Ref Range   Color, Urine YELLOW (A) YELLOW   APPearance HAZY (A) CLEAR   Specific Gravity, Urine 1.027 1.005 - 1.030   pH 5.0 5.0 - 8.0   Glucose, UA NEGATIVE NEGATIVE mg/dL   Hgb urine dipstick NEGATIVE NEGATIVE   Bilirubin Urine NEGATIVE NEGATIVE   Ketones, ur 80 (A) NEGATIVE mg/dL   Protein, ur 254 (A) NEGATIVE mg/dL   Nitrite NEGATIVE NEGATIVE   Leukocytes,Ua NEGATIVE NEGATIVE   RBC / HPF 0-5 0 - 5 RBC/hpf   WBC, UA 0-5 0 - 5 WBC/hpf   Bacteria, UA NONE SEEN NONE SEEN   Squamous Epithelial / LPF 6-10 0 - 5   Mucus PRESENT      Treatments: IV hydration  Discharge Exam: Blood pressure 109/67, pulse 69, temperature 98.9 F (37.2 C), temperature source Oral, resp. rate 18, height 5\' 8"  (1.727 m), weight 53.8 kg, last menstrual period 03/29/2020, SpO2 100 %, unknown if currently breastfeeding.  General: NAD, appears  stated age HEENT: normocephalic, anicteric Pulmonary: no increased work of breathing   Disposition: Discharge disposition: 01-Home or Self Care       Discharge Instructions    Discharge activity:  No Restrictions   Complete by: As directed    Discharge diet:  No restrictions   Complete by: As directed    No sexual activity restrictions   Complete by: As directed    Notify physician for a general feeling that "something is not right"   Complete by: As directed    Notify physician for increase or change in vaginal discharge   Complete by: As directed    Notify physician for intestinal cramps, with or without diarrhea, sometimes described as "gas pain"   Complete by: As directed    Notify physician for leaking of fluid   Complete by: As directed    Notify physician for low, dull backache, unrelieved by heat or Tylenol   Complete by: As directed    Notify physician for menstrual like cramps   Complete by: As directed    Notify physician for pelvic pressure   Complete by: As directed    Notify physician for uterine contractions.  These may be painless and feel like the uterus is tightening or the baby is  "balling up"   Complete by: As directed    Notify physician for vaginal bleeding   Complete by: As directed    PRETERM LABOR:  Includes any of the follwing symptoms that occur between 20 -  [redacted] weeks gestation.  If these symptoms are not stopped, preterm labor can result in preterm delivery, placing your baby at risk   Complete by: As directed      Allergies as of 05/28/2020   No Known Allergies     Medication List    STOP taking these medications   acetaminophen 325 MG tablet Commonly known as: Tylenol   ferrous sulfate 325 (65 FE) MG tablet Commonly known as: FerrouSul   ibuprofen 600 MG tablet Commonly known as: ADVIL     TAKE these medications   escitalopram 10 MG tablet Commonly known as: Lexapro Take 1 tablet (10 mg total) by mouth daily.   metoCLOPramide  10 MG tablet Commonly known as: REGLAN Take 1 tablet (10 mg total) by mouth 3 (three) times daily before meals.   ondansetron 4 MG disintegrating tablet Commonly known as: ZOFRAN-ODT Take 1 tablet (4 mg total) by mouth every 6 (six) hours as needed for nausea or vomiting.   Prenatal Vitamins 28-0.8 MG Tabs Take 1 tablet by mouth daily.   promethazine 25 MG tablet Commonly known as: PHENERGAN Take 1 tablet (25 mg total) by mouth every 6 (six) hours as needed for nausea or vomiting.   promethazine 25 MG suppository Commonly known as: PHENERGAN Place 1 suppository (25 mg total) rectally every 6 (six) hours as needed for nausea or vomiting.        Signed: Vena Austria 05/28/2020, 3:00 PM

## 2020-05-30 ENCOUNTER — Other Ambulatory Visit: Payer: Self-pay | Admitting: Obstetrics and Gynecology

## 2020-05-30 ENCOUNTER — Other Ambulatory Visit: Payer: Self-pay

## 2020-05-30 ENCOUNTER — Ambulatory Visit (INDEPENDENT_AMBULATORY_CARE_PROVIDER_SITE_OTHER): Payer: No Typology Code available for payment source | Admitting: Obstetrics and Gynecology

## 2020-05-30 ENCOUNTER — Ambulatory Visit (INDEPENDENT_AMBULATORY_CARE_PROVIDER_SITE_OTHER): Payer: No Typology Code available for payment source

## 2020-05-30 ENCOUNTER — Other Ambulatory Visit (HOSPITAL_COMMUNITY)
Admission: RE | Admit: 2020-05-30 | Discharge: 2020-05-30 | Disposition: A | Discharge status: Home / Self Care | Payer: No Typology Code available for payment source | Source: Ambulatory Visit | Attending: Obstetrics and Gynecology | Admitting: Obstetrics and Gynecology

## 2020-05-30 ENCOUNTER — Encounter: Payer: Self-pay | Admitting: Obstetrics and Gynecology

## 2020-05-30 VITALS — BP 116/70 | Wt 127.0 lb

## 2020-05-30 DIAGNOSIS — Z3481 Encounter for supervision of other normal pregnancy, first trimester: Secondary | ICD-10-CM | POA: Diagnosis present

## 2020-05-30 DIAGNOSIS — Z3401 Encounter for supervision of normal first pregnancy, first trimester: Secondary | ICD-10-CM

## 2020-05-30 DIAGNOSIS — Z3480 Encounter for supervision of other normal pregnancy, unspecified trimester: Secondary | ICD-10-CM | POA: Insufficient documentation

## 2020-05-30 DIAGNOSIS — Z3A08 8 weeks gestation of pregnancy: Secondary | ICD-10-CM | POA: Diagnosis not present

## 2020-05-30 DIAGNOSIS — O210 Mild hyperemesis gravidarum: Secondary | ICD-10-CM

## 2020-05-30 NOTE — Progress Notes (Signed)
New Obstetric Patient H&P   Chief Complaint: "Desires prenatal care"   History of Present Illness: Patient is a 24 y.o. G3P1011 Not Hispanic or Latino female, LMP 03/29/2020 presents with amenorrhea and positive home pregnancy test. Based on her  LMP, her EDD is Estimated Date of Delivery: 01/03/21 and her EGA is [redacted]w[redacted]d. Her pregnancy EDD has been confirmed by a 7 week ultrasound. Her last pap smear was 1 year ago and was low-grade squamous intraepithelial neoplasia (LGSIL - encompassing HPV,mild dysplasia,CIN I).    Since her LMP she claims she has experienced nausea and emesis. She was hospitalized overnight a few days ago for dehydration and hyperemesis. She denies vaginal bleeding. Her past medical history is noncontributory. Her prior pregnancies were uncomplicated.    Since her LMP, she admits to the use of tobacco products: no She claims she has last about 10 pounds since the start of her pregnancy due to nausea and has gained some back.  There are cats in the home in the home  no  She admits close contact with children on a regular basis  yes  She has had chicken pox in the past yes She has had Tuberculosis exposures, symptoms, or previously tested positive for TB   no Current or past history of domestic violence. no  Genetic Screening/Teratology Counseling: (Includes patient, baby's father, or anyone in either family with:)   1. Patient's age >/= 4 [facility]  no 2. Thalassemia ([place], [place], Mediterranean, or Asian background): MCV<80  no 3. Neural tube defect (meningomyelocele, spina bifida, anencephaly)  no 4. Congenital heart defect  no  5. Down syndrome  no 6. Tay-Sachs (Jewish, [place]  no 7. Canavan's Disease  no 8. Sickle cell disease or trait (African)  no  9. Hemophilia or other blood disorders  no  10. Muscular dystrophy  no  11. Cystic fibrosis  no  12. Huntington's Chorea  no  13. Mental retardation/autism  no 14. Other inherited genetic or chromosomal  disorder  no 15. Maternal metabolic disorder (DM, PKU, etc)  no 16. Patient or FOB with a child with a birth defect not listed above no  16a. Patient or FOB with a birth defect themselves no 17. Recurrent pregnancy loss, or stillbirth  no  18. Any medications since LMP other than prenatal vitamins (include vitamins, supplements, OTC meds, drugs, alcohol)  no 19. Any other genetic/environmental exposure to discuss  no  Infection History:   1. Lives with someone with TB or TB exposed  no  2. Patient or partner has history of genital herpes  no 3. Rash or viral illness since LMP  no 4. History of STI (GC, CT, HPV, syphilis, HIV)  no 5. History of recent travel :  no  Other pertinent information:  no     Review of Systems:10 point review of systems negative unless otherwise noted in HPI  Past Medical History:  Diagnosis Date  . No known health problems     Past Surgical History:  Procedure Laterality Date  . No past surgery     Gynecologic History: Patient's last menstrual period was 03/29/2020 (exact date).  Obstetric History: G3P1011  Family History  Problem Relation Age of Onset  . Breast cancer Neg Hx   . Ovarian cancer Neg Hx     Social History   Socioeconomic History  . Marital status: Single    Spouse name: Not on file  . Number of children: Not on file  . Years of education:  Not on file  . Highest education level: Not on file  Occupational History  . Occupation: [employer]   Tobacco Use  . Smoking status: Former Games developer  . Smokeless tobacco: Never Used  Vaping Use  . Vaping Use: Former  Substance and Sexual Activity  . Alcohol use: Not Currently  . Drug use: Yes    Types: Marijuana    Comment: Stoped when found out pregnant  . Sexual activity: Never    Birth control/protection: None  Other Topics Concern  . Not on file  Social History Narrative  . Not on file   Social Determinants of Health   Financial Resource Strain:   . Difficulty of Paying  Living Expenses: Not on file  Food Insecurity:   . Worried About Programme researcher, broadcasting/film/video in the Last Year: Not on file  . Ran Out of Food in the Last Year: Not on file  Transportation Needs:   . Lack of Transportation (Medical): Not on file  . Lack of Transportation (Non-Medical): Not on file  Physical Activity:   . Days of Exercise per Week: Not on file  . Minutes of Exercise per Session: Not on file  Stress:   . Feeling of Stress : Not on file  Social Connections:   . Frequency of Communication with Friends and Family: Not on file  . Frequency of Social Gatherings with Friends and Family: Not on file  . Attends Religious Services: Not on file  . Active Member of Clubs or Organizations: Not on file  . Attends Banker Meetings: Not on file  . Marital Status: Not on file  Intimate Partner Violence:   . Fear of Current or Ex-Partner: Not on file  . Emotionally Abused: Not on file  . Physically Abused: Not on file  . Sexually Abused: Not on file   Allergies:No Known Allergies  Prior to Admission medications   Medication Sig Start Date End Date Taking? Authorizing Provider  metoCLOPramide (REGLAN) 10 MG tablet Take 1 tablet (10 mg total) by mouth 3 (three) times daily before meals. 05/28/20  Yes Vena [place], MD  ondansetron (ZOFRAN-ODT) 4 MG disintegrating tablet Take 1 tablet (4 mg total) by mouth every 6 (six) hours as needed for nausea or vomiting. 05/28/20  Yes Vena [place], MD  Prenatal Vit-Fe Fumarate-FA (PRENATAL VITAMINS) 28-0.8 MG TABS Take 1 tablet by mouth daily.   Yes [provider]    Physical Exam BP 116/70   Wt 127 lb (57.6 kg)   LMP 03/29/2020 (Exact Date)   BMI 19.31 kg/m   Physical Exam Constitutional:      General: She is not in acute distress.    Appearance: She is well-developed.  HENT:     Head: Normocephalic and atraumatic.  Eyes:     Conjunctiva/sclera: Conjunctivae normal.  Neck:     Thyroid: No thyromegaly.   Cardiovascular:     Rate and Rhythm: Normal rate and regular rhythm.     Heart sounds: Normal heart sounds. No murmur heard.  No friction rub. No gallop.   Pulmonary:     Effort: Pulmonary effort is normal.     Breath sounds: Normal breath sounds. No wheezing.  Abdominal:     General: There is no distension.     Palpations: Abdomen is soft. There is no mass.     Tenderness: There is no abdominal tenderness. There is no guarding or rebound.     Hernia: No hernia is present. There is no hernia in  the left inguinal area or right inguinal area.  Genitourinary:    General: Normal vulva.     Exam position: Lithotomy position.     Tanner stage (genital): 5.     Labia:        Right: No rash, tenderness or lesion.        Left: No rash, tenderness or lesion.      Vagina: Normal.     Cervix: Normal.     Uterus: Enlarged. Not tender.      Adnexa: Right adnexa normal and left adnexa normal.  Musculoskeletal:        General: Normal range of motion.     Cervical back: Normal range of motion and neck supple.  Lymphadenopathy:     Lower Body: No right inguinal adenopathy. No left inguinal adenopathy.  Skin:    General: Skin is warm and dry.     Findings: No rash.  Neurological:     Mental Status: She is alert and oriented to person, place, and time.  Psychiatric:        Behavior: Behavior normal.      Female Chaperone present during breast and/or pelvic exam.   Assessment: 24 y.o. G3P1011 at [redacted]w[redacted]d presenting to initiate prenatal care  Plan: 1) Avoid alcoholic beverages. 2) Patient encouraged not to smoke.  3) Discontinue the use of all non-medicinal drugs and chemicals.  4) Take prenatal vitamins daily.  5) Nutrition, food safety (fish, cheese advisories, and high nitrite foods) and exercise discussed. 6) Hospital and practice style discussed with cross coverage system.  7) Genetic Screening, such as with 1st Trimester Screening, cell free fetal DNA, AFP testing, and Ultrasound, as  well as with amniocentesis and CVS as appropriate, is discussed with patient. At the conclusion of today's visit patient undecided genetic testing 8) Patient is asked about travel to areas at risk for the [place] virus, and counseled to avoid travel and exposure to mosquitoes or sexual partners who may have themselves been exposed to the virus. Testing is discussed, and will be ordered as appropriate.   Thomasene Mohair, MD 05/30/2020 2:32 PM

## 2020-05-31 LAB — RPR+RH+ABO+RUB AB+AB SCR+CB...
Antibody Screen: NEGATIVE
HIV Screen 4th Generation wRfx: NONREACTIVE
Hematocrit: 33.9 % — ABNORMAL LOW (ref 34.0–46.6)
Hemoglobin: 11.7 g/dL (ref 11.1–15.9)
Hepatitis B Surface Ag: NEGATIVE
MCH: 29.2 pg (ref 26.6–33.0)
MCHC: 34.5 g/dL (ref 31.5–35.7)
MCV: 85 fL (ref 79–97)
Platelets: 192 10*3/uL (ref 150–450)
RBC: 4.01 x10E6/uL (ref 3.77–5.28)
RDW: 13.1 % (ref 11.7–15.4)
RPR Ser Ql: NONREACTIVE
Rh Factor: POSITIVE
Rubella Antibodies, IGG: 1.09 index (ref >0.99)
Varicella zoster IgG: 670 index (ref >165)
WBC: 8.6 10*3/uL (ref 3.4–10.8)

## 2020-06-01 LAB — CYTOLOGY - PAP
Chlamydia: NEGATIVE
Comment: NEGATIVE
Comment: NEGATIVE
Comment: NORMAL
Diagnosis: NEGATIVE
Neisseria Gonorrhea: NEGATIVE
Trichomonas: NEGATIVE

## 2020-06-01 LAB — CERVICOVAGINAL ANCILLARY ONLY
Chlamydia: NEGATIVE
Comment: NEGATIVE
Comment: NEGATIVE
Comment: NORMAL
Neisseria Gonorrhea: NEGATIVE
Trichomonas: NEGATIVE

## 2020-06-01 LAB — URINE CULTURE

## 2020-06-07 LAB — URINE DRUG PANEL 7
Amphetamines, Urine: NEGATIVE ng/mL
Barbiturate Quant, Ur: NEGATIVE ng/mL
Benzodiazepine Quant, Ur: NEGATIVE ng/mL
Cannabinoid Quant, Ur: POSITIVE — AB
Cocaine (Metab.): NEGATIVE ng/mL
Opiate Quant, Ur: NEGATIVE ng/mL
PCP Quant, Ur: NEGATIVE ng/mL

## 2020-06-27 ENCOUNTER — Encounter: Payer: No Typology Code available for payment source | Admitting: Obstetrics & Gynecology

## 2020-10-06 IMAGING — US US OB < 14 WEEKS - US OB TV
1 series · 13 of 28 positions shown · non-contrast
Comparison: Obstetrical ultrasound of prior gestation 10/19/2019

CLINICAL DATA: Lower abdominal pain since this morning, emesis for
1 week, quantitative beta hCG 84,201

EXAM:
OBSTETRIC <14 WK US AND TRANSVAGINAL OB US
TECHNIQUE: Both transabdominal and transvaginal ultrasound examinations were
performed for complete evaluation of the gestation as well as the
maternal uterus, adnexal regions, and pelvic cul-de-sac.
Transvaginal technique was performed to assess early pregnancy.

[Series 1: us ob less than 14 weeks with ob transvaginal · 13 of 117 slices shown]
[im 5/117]
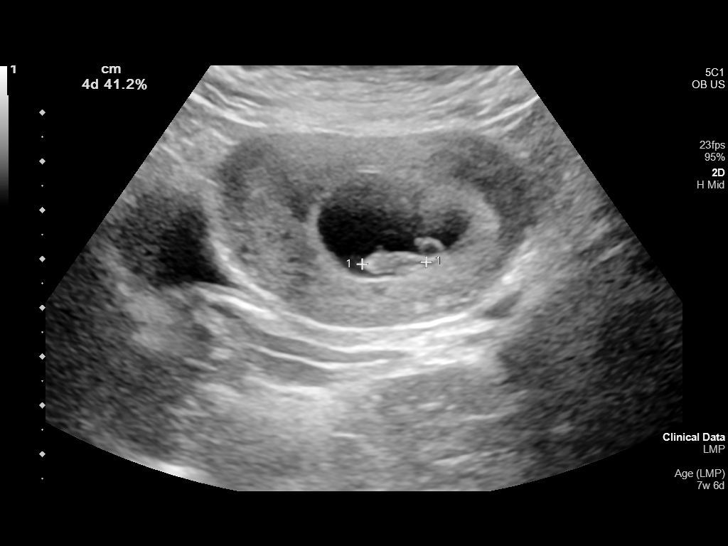
[im 13/117]
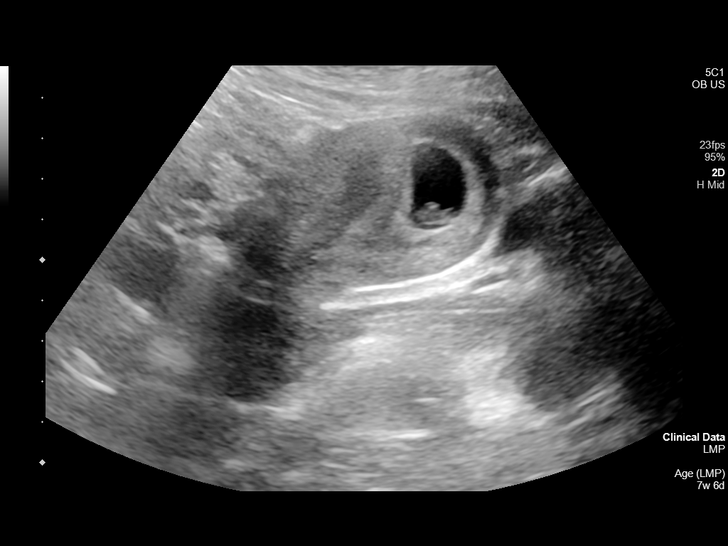
[im 22/117]
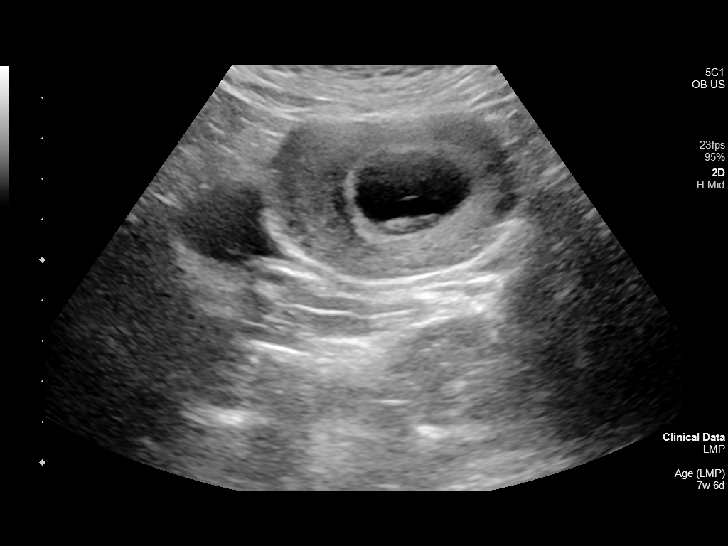
[im 31/117]
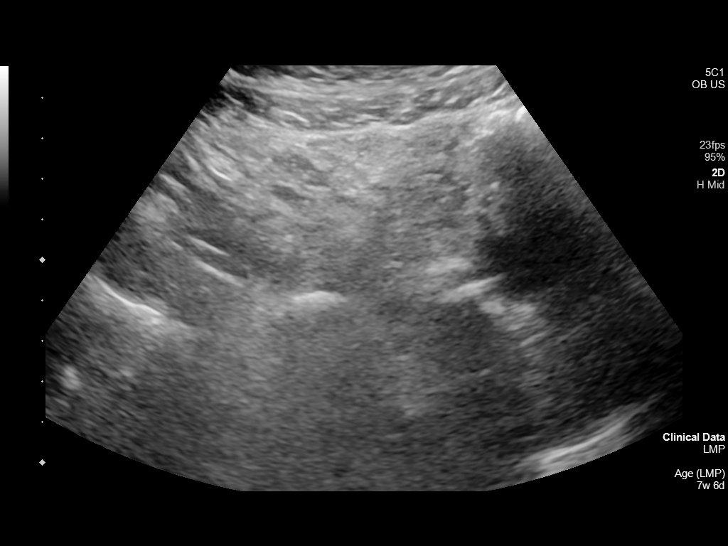
[im 39/117]
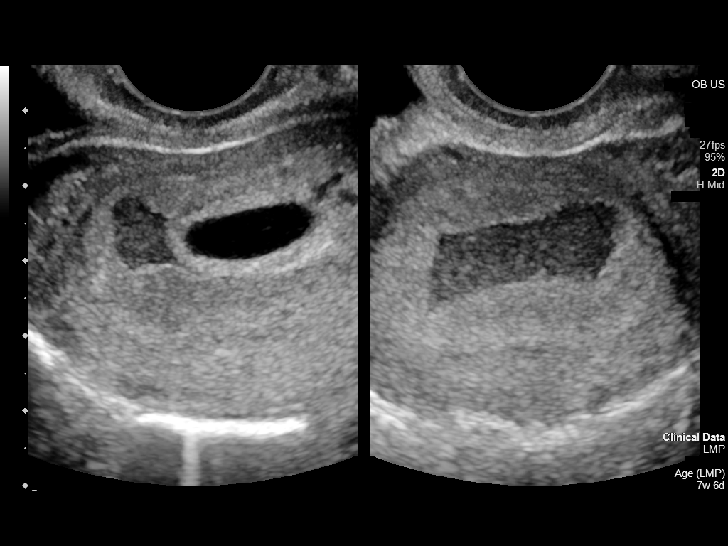
[im 48/117]
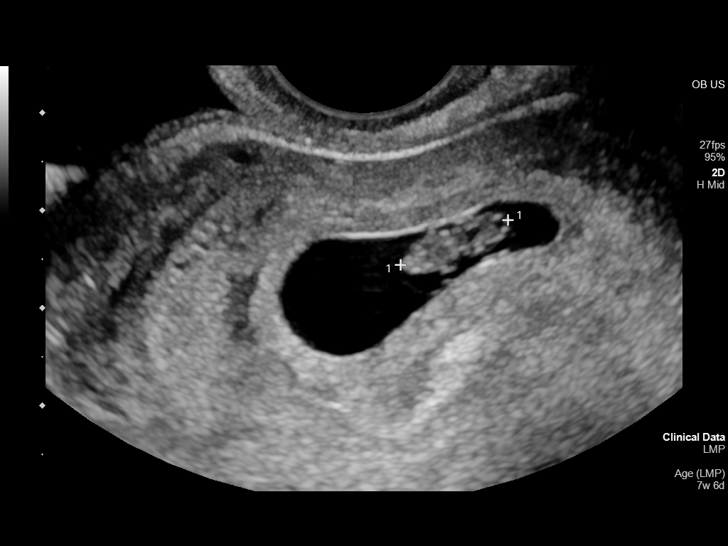
[im 61/117]
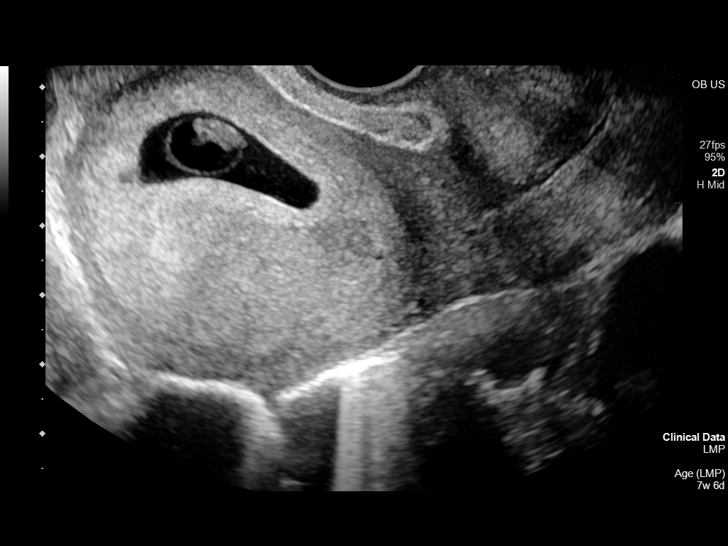
[im 69/117]
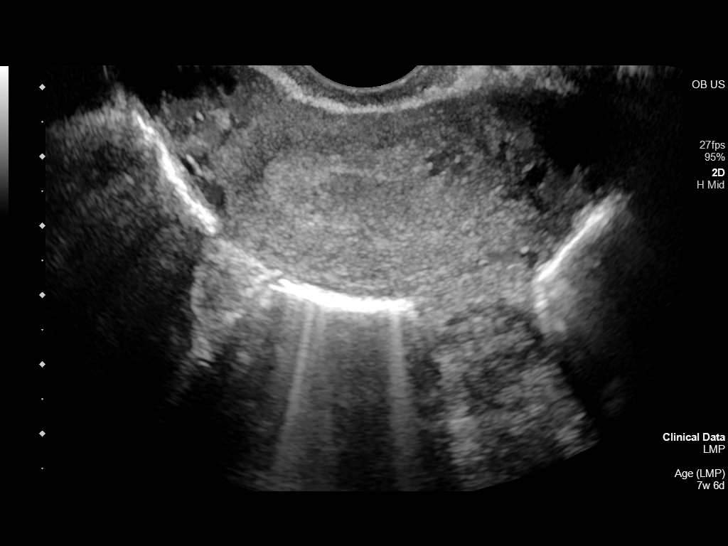
[im 78/117]
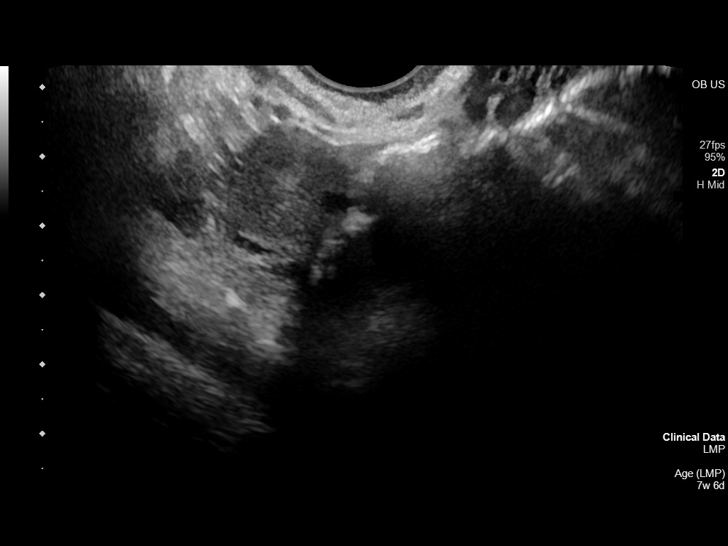
[im 86/117]
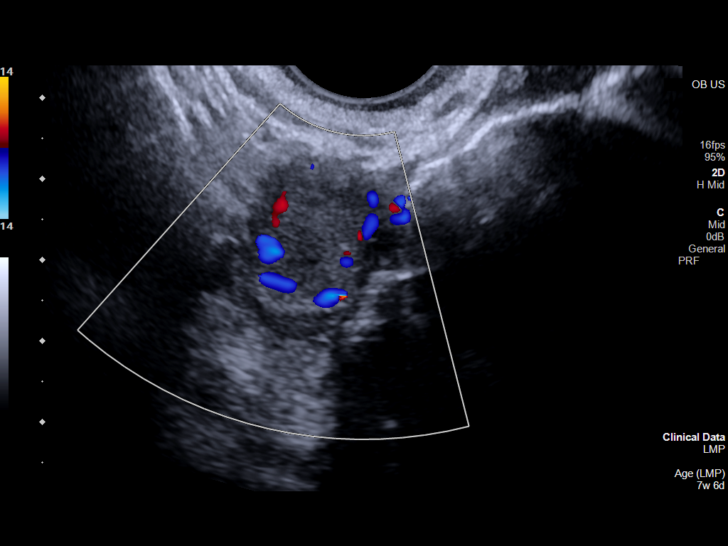
[im 95/117]
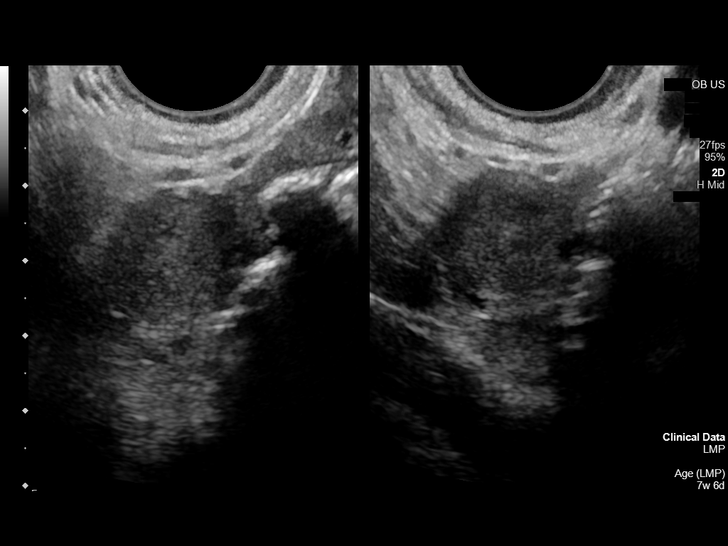
[im 104/117]
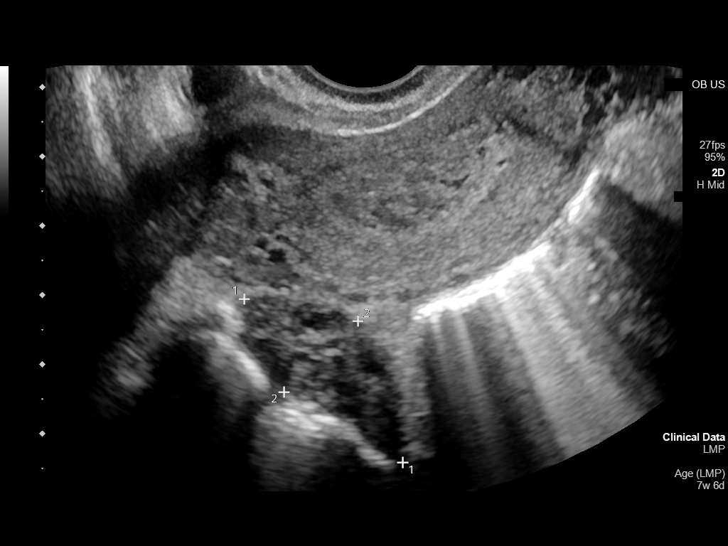
[im 112/117]
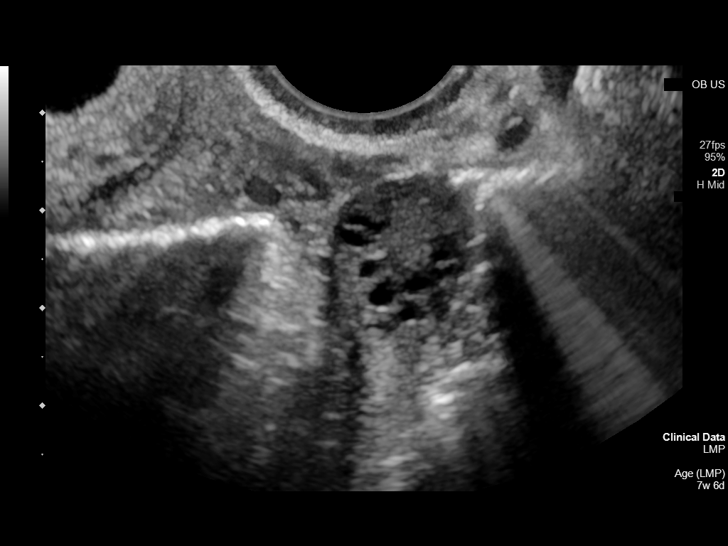

[13 of 28 positions shown; findings below may reference images not displayed]

FINDINGS: Intrauterine gestational sac: Single

Yolk sac:  Visualized.

Embryo:  Visualized.

Cardiac Activity: Visualized.

Heart Rate: 164 bpm

CRL:   12.3 mm   7 w 3 d                  US EDC: 01/06/2021

Subchorionic hemorrhage: Moderate volume subchorionic hemorrhage is
noted measuring 2.5 x 1.1 x 0.8 cm

Maternal uterus/adnexae: Anteverted maternal uterus without other
acute abnormality. Corpus luteum in the right ovary. No concerning
adnexal abnormalities. Trace free fluid in the pelvis is nonspecific
and possibly physiologic.
IMPRESSION: Single intrauterine gestation at a gestational age of 7 weeks 3 days
by crown-rump length sonographic estimation.

Moderate subchorionic hemorrhage. Consider obstetrical consultation
for further management. No further sonographic complication.

Small volume anechoic fluid in the pelvis, nonspecific though
possibly physiologic.

## 2021-01-10 ENCOUNTER — Ambulatory Visit (INDEPENDENT_AMBULATORY_CARE_PROVIDER_SITE_OTHER): Payer: BC Managed Care – PPO | Admitting: Internal Medicine

## 2021-01-10 ENCOUNTER — Encounter: Payer: Self-pay | Admitting: Internal Medicine

## 2021-01-10 ENCOUNTER — Other Ambulatory Visit: Payer: Self-pay

## 2021-01-10 VITALS — BP 105/73 | HR 95 | Temp 99.1°F | Ht 66.54 in | Wt 119.4 lb

## 2021-01-10 DIAGNOSIS — L719 Rosacea, unspecified: Secondary | ICD-10-CM | POA: Diagnosis not present

## 2021-01-10 NOTE — Patient Instructions (Signed)
What is rosacea?--Rosacea is a skin condition that can cause redness and raised bumps on the cheeks, nose, chin, or forehead. It can also affect the eyes. Rosacea is a long-term condition that can get worse over time. Rosacea happens most often in adults ages 31 to 25. What are the symptoms of rosacea?--Rosacea usually affects the cheeks, nose, chin, forehead, or eyes. People with rosacea can have different symptoms. The most common symptoms include: ?Redness on the nose and cheeks that doesn't go away (picture 1) ?Blushing easily ?Raised, red bumps with or without pus in them (picture 2) - Bumps from rosacea can sometimes look like acne, but they aren't acne. ?Tiny, swollen blood vessels on the skin (called "telangiectasias") ?A burning or gritty feeling in the eyes ?A red, swollen, and rounded nose In people with dark skin, sometimes the redness can be hard to see. Sometimes, people's symptoms are under control. Other times, symptoms worsen and flare up. There are some things that might make redness on the face worse. Examples include: ?Eating hot or spicy foods, or drinking hot drinks ?Drinking alcohol  Rosacea Rosacea is a long-term (chronic) condition that affects the skin of the face, including the cheeks, nose, forehead, and chin. This condition can also affect the eyes. Rosacea causes blood vessels near the surface of the skin to enlarge, which results in redness. What are the causes? The cause of this condition is not known. Certain triggers can make rosacea worse, including:  Hot baths.  Exercise.  Sunlight.  Very hot or cold temperatures.  Hot or spicy foods and drinks.  Drinking alcohol.  Stress.  Taking blood pressure medicine.  Long-term use of topical steroids on the face. What increases the risk? You are more likely to develop this condition if you:  Are older than 23 years of age.  Are a woman.  Have light-colored skin (light complexion).  Have a  family history of rosacea. What are the signs or symptoms? Symptoms of this condition include:  Redness of the face.  Red bumps or pimples on the face.  A red, enlarged nose.  Blushing easily.  Red lines on the skin.  Irritated, burning, or itchy feeling in the eyes.  Swollen eyelids.  Drainage from the eyes.  Feeling like there is something in your eye.   How is this diagnosed? This condition is diagnosed with a medical history and physical exam. How is this treated? There is no cure for this condition, but treatment can help to control your symptoms. Your health care provider may recommend that you see a skin specialist (dermatologist). Treatment may include:  Medicines that are applied to the skin or taken by mouth (orally). This can include antibiotic medicines.  Laser treatment to improve the appearance of the skin.  Surgery. This is rare. Your health care provider will also recommend the best way to take care of your skin. Even after your skin improves, you will likely need to continue treatment to prevent your rosacea from coming back. Follow these instructions at home: Skin care Take care of your skin as told by your health care provider. You may be told to do these things:  Wash your skin gently two or more times each day.  Use mild soap.  Use a sunscreen or sunblock with SPF 30 or greater.  Use gentle cosmetics that are meant for sensitive skin.  Shave with an electric shaver instead of a blade. Lifestyle  Try to keep track of what foods trigger this condition. Avoid  any triggers. These may include: ? Spicy foods. ? Seafood. ? Cheese. ? Hot liquids. ? Nuts. ? Chocolate. ? Iodized salt.  Do not drink alcohol.  Avoid extremely cold or hot temperatures.  Try to reduce your stress. If you need help, talk with your health care provider.  When you exercise, do these things to stay cool: ? Limit sun exposure to your face. ? Use a fan. ? Do shorter  and more frequent intervals of exercise. General instructions  Take and apply over-the-counter and prescription medicines only as told by your health care provider.  If you were prescribed an antibiotic medicine, apply it or take it as told by your health care provider. Do not stop using the antibiotic even if your condition improves.  If your eyelids are affected, apply warm compresses to them. Do this as told by your health care provider.  Keep all follow-up visits as told by your health care provider. This is important. Contact a health care provider if:  Your symptoms get worse.  Your symptoms do not improve after 2 months of treatment.  You have new symptoms.  You have any changes in vision or you have problems with your eyes, such as redness or itching.  You feel depressed.  You lose your appetite.  You have trouble concentrating. Summary  Rosacea is a long-term (chronic) condition that affects the skin of the face, including the cheeks, nose, forehead, and chin.  Take care of your skin as told by your health care provider.  Take and apply over-the-counter and prescription medicines only as told by your health care provider.  Contact a health care provider if your symptoms get worse or if you have any changes in vision or other problems with your eyes, such as redness or itching.  Keep all follow-up visits as told by your health care provider. This is important. This information is not intended to replace advice given to you by your health care provider. Make sure you discuss any questions you have with your health care provider. Document Revised: 01/15/2018 Document Reviewed: 01/15/2018 Elsevier Patient Education  2021 [employer]. ?Being too hot or cold ?Sunlight ?Stress and other strong emotions How do I know if I have rosacea?--Most of the time, your doctor or nurse can tell if you have it by learning about your symptoms and doing an exam. How is rosacea  treated?--Treatments do not cure rosacea, but they help control symptoms and prevent flare-ups. They involve: ?Medicines and other treatments - There are different ways to treat rosacea. Medicines can come as gels, creams, or lotions that go on your skin, or as pills that you swallow. You will likely need to take or use medicines for a long time. Special lasers can also help some people with rosacea. ?Lifestyle changes - To help control your symptoms and prevent flare-ups, you should: .Try to avoid things that you know worsen your symptoms .Use mild, unscented face cleansers to wash your face .Wear sunscreen every day .Avoid using products on your face with alcohol, acid, or other ingredients that could bother your skin What if my symptoms are severe or don't get better?--If your symptoms are severe or don't get better with treatment, you will probably need to see a skin specialist (called a dermatologist). The specialist will talk with you about other possible treatments. What if I want to get pregnant?--If you want to get pregnant, talk with your doctor or nurse. Some medicines for rosacea are not safe to take during pregnancy.  Your doctor or nurse will make sure that your medicine is safe to take.

## 2021-01-10 NOTE — Progress Notes (Signed)
BP 105/73   Pulse 95   Temp 99.1 F (37.3 C) (Oral)   Ht 5' 6.54" (1.69 m)   Wt 119 lb 6.4 oz (54.2 kg)   LMP 03/29/2020 (Exact Date)   SpO2 96%   BMI 18.96 kg/m    Subjective:    Patient ID: Sonya Shannon, female    DOB: Mar 29, 1996, 25 y.o.   MRN: [id number]  HPI: Sonya Shannon is a 25 y.o. female  Rash This is a chronic problem. The current episode started more than 1 year ago. Pertinent negatives include no congestion, cough, diarrhea, eye pain, fatigue, fever, shortness of breath or vomiting.    Chief Complaint  Patient presents with  . New Patient (Initial Visit)    Rosacea, patient has had all her life    Relevant past medical, surgical, family and social history reviewed and updated as indicated. Interim medical history since our last visit reviewed. Allergies and medications reviewed and updated.  Review of Systems  Constitutional: Negative for activity change, appetite change, chills, fatigue and fever.  HENT: Negative for congestion, ear discharge, ear pain and facial swelling.   Eyes: Negative for pain and itching.  Respiratory: Negative for cough, chest tightness, shortness of breath and wheezing.   Cardiovascular: Negative for chest pain, palpitations and leg swelling.  Gastrointestinal: Negative for abdominal distention, abdominal pain, blood in stool, constipation, diarrhea, nausea and vomiting.  Endocrine: Negative for cold intolerance, heat intolerance, polydipsia, polyphagia and polyuria.  Genitourinary: Negative for difficulty urinating, dysuria, flank pain, frequency, hematuria and urgency.  Musculoskeletal: Negative for arthralgias, gait problem, joint swelling and myalgias.  Skin: Positive for rash. Negative for color change and wound.  Neurological: Negative for dizziness, tremors, speech difficulty, weakness, light-headedness, numbness and headaches.  Hematological: Does not bruise/bleed easily.  Psychiatric/Behavioral: Negative for  agitation, confusion, decreased concentration, sleep disturbance and suicidal ideas.    Per HPI unless specifically indicated above     Objective:    BP 105/73   Pulse 95   Temp 99.1 F (37.3 C) (Oral)   Ht 5' 6.54" (1.69 m)   Wt 119 lb 6.4 oz (54.2 kg)   LMP 03/29/2020 (Exact Date)   SpO2 96%   BMI 18.96 kg/m   Wt Readings from Last 3 Encounters:  01/10/21 119 lb 6.4 oz (54.2 kg)  05/30/20 127 lb (57.6 kg)  05/27/20 118 lb 9.7 oz (53.8 kg)    Physical Exam Constitutional:      Appearance: Normal appearance. She is normal weight.  Skin:    Findings: Erythema present.     Comments: On bilateral cheeks.   Neurological:     Mental Status: She is alert.     Results for orders placed or performed in visit on 05/30/20  Urine Culture   Specimen: Urine   UR  Result Value Ref Range   Urine Culture, Routine Final report    Organism ID, Bacteria Comment   RPR+Rh+ABO+Rub Ab+Ab Scr+CB...  Result Value Ref Range   HIV Screen 4th Generation wRfx Non Reactive Non Reactive   Varicella zoster IgG 670 Immune >165 index   Hepatitis B Surface Ag Negative Negative   RPR Ser Ql Non Reactive Non Reactive   Rubella Antibodies, IGG 1.09 Immune >0.99 index   ABO Grouping A    Rh Factor Positive    Antibody Screen Negative Negative   WBC 8.6 3.4 - 10.8 x10E3/uL   RBC 4.01 3.77 - 5.28 x10E6/uL   Hemoglobin 11.7 11.1 - 15.9  g/dL   Hematocrit 95.2 (L) 84.1 - 46.6 %   MCV 85 79 - 97 fL   MCH 29.2 26.6 - 33.0 pg   MCHC 34.5 31.5 - 35.7 g/dL   RDW 32.4 40.1 - 02.7 %   Platelets 192 150 - 450 x10E3/uL  Urine Drug Panel 7  Result Value Ref Range   Amphetamines, Urine Negative Cutoff=1000 ng/mL   Barbiturate Quant, Ur Negative Cutoff=300 ng/mL   Benzodiazepine Quant, Ur Negative Cutoff=300 ng/mL   Cannabinoid Quant, Ur Positive (A) Cutoff=50   Cocaine (Metab.) Negative Cutoff=300 ng/mL   Opiate Quant, Ur Negative Cutoff=300 ng/mL   PCP Quant, Ur Negative Cutoff=25 ng/mL   Cervicovaginal ancillary only  Result Value Ref Range   Neisseria Gonorrhea Negative    Chlamydia Negative    Trichomonas Negative    Comment Normal Reference Range Trichomonas - Negative    Comment Normal Reference Ranger Chlamydia - Negative    Comment      Normal Reference Range Neisseria Gonorrhea - Negative  Cytology - PAP  Result Value Ref Range   Neisseria Gonorrhea Negative    Chlamydia Negative    Trichomonas Negative    Adequacy      Satisfactory for evaluation; transformation zone component PRESENT.   Diagnosis      - Negative for intraepithelial lesion or malignancy (NILM)   Microorganisms      Fungal organisms present consistent with Candida spp.   Comment Normal Reference Range Trichomonas - Negative    Comment Normal Reference Ranger Chlamydia - Negative    Comment      Normal Reference Range Neisseria Gonorrhea - Negative       No current outpatient medications on file.    Assessment & Plan:  Rosacea Pt advised to avoid using harsh chemicals on skin use sun protection including sun screen  Will refer to derm for further tx and mx.  Information provided to pt on above.   Problem List Items Addressed This Visit   None   Visit Diagnoses    Rosacea    -  Primary   Relevant Orders   Ambulatory referral to Dermatology

## 2021-01-11 ENCOUNTER — Encounter: Payer: Self-pay | Admitting: Internal Medicine

## 2021-02-01 ENCOUNTER — Emergency Department
Admission: EM | Admit: 2021-02-01 | Discharge: 2021-02-01 | Disposition: A | Discharge status: Home / Self Care | Payer: BC Managed Care – PPO | Attending: Emergency Medicine | Admitting: Emergency Medicine

## 2021-02-01 ENCOUNTER — Encounter: Payer: Self-pay | Admitting: *Deleted

## 2021-02-01 ENCOUNTER — Other Ambulatory Visit: Payer: Self-pay

## 2021-02-01 DIAGNOSIS — Z87891 Personal history of nicotine dependence: Secondary | ICD-10-CM | POA: Diagnosis not present

## 2021-02-01 DIAGNOSIS — H60502 Unspecified acute noninfective otitis externa, left ear: Secondary | ICD-10-CM

## 2021-02-01 DIAGNOSIS — H9202 Otalgia, left ear: Secondary | ICD-10-CM

## 2021-02-01 MED ORDER — LIDOCAINE HCL (PF) 1 % IJ SOLN
2.0000 mL | Freq: Once | INTRAMUSCULAR | Status: AC
  Administered 2021-02-01: 02:00:00 2 mL
  Filled 2021-02-01: qty 5

## 2021-02-01 MED ORDER — CIPROFLOXACIN-DEXAMETHASONE 0.3-0.1 % OT SUSP: 4.0000 [drp] | Freq: Two times a day (BID) | OTIC | 0 refills | Status: AC

## 2021-02-01 MED ORDER — CIPROFLOXACIN-DEXAMETHASONE 0.3-0.1 % OT SUSP
4.0000 [drp] | Freq: Once | OTIC | Status: AC
  Administered 2021-02-01: 02:00:00 4 [drp] via OTIC
  Filled 2021-02-01: qty 7.5

## 2021-02-01 NOTE — Discharge Instructions (Signed)
Apply Ciprodex 2 drops to left ear twice daily x7 days.  Return to the ER for worsening symptoms, persistent vomiting, fever or other concerns.

## 2021-02-01 NOTE — ED Provider Notes (Signed)
[facility] Emergency Department Provider Note   ____________________________________________   Event Date/Time   First MD Initiated Contact with Patient 02/01/21 0151     (approximate)  I have reviewed the triage vital signs and the nursing notes.   HISTORY  Chief Complaint Ear Fullness    HPI Sonya Shannon is a 25 y.o. female who presents to the ED from home with a chief complaint of otalgia.  Patient reports waking up around midnight with sharp pain and fullness to her left ear.  States her hearing feels muffled.  Went swimming recently.  Denies barotrauma.  Denies fever, discharge, nausea, vomiting or dizziness.     Past Medical History:  Diagnosis Date  . No known health problems     Patient Active Problem List   Diagnosis Date Noted  . Supervision of other normal pregnancy, antepartum 05/30/2020  . Hyperemesis gravidarum 05/27/2020  . Epigastric hernia 11/27/2012    Past Surgical History:  Procedure Laterality Date  . No past surgery      Prior to Admission medications   Medication Sig Start Date End Date Taking? Authorizing Provider  ciprofloxacin-dexamethasone (CIPRODEX) OTIC suspension Place 4 drops into the left ear 2 (two) times daily for 7 days. 02/01/21 02/08/21 Yes Irean Hong, MD    Allergies Patient has no known allergies.  Family History  Problem Relation Age of Onset  . Breast cancer Neg Hx   . Ovarian cancer Neg Hx     Social History Social History   Tobacco Use  . Smoking status: Former Games developer  . Smokeless tobacco: Never Used  Vaping Use  . Vaping Use: Former  Substance Use Topics  . Alcohol use: Not Currently  . Drug use: Yes    Types: Marijuana    Comment: Stoped when found out pregnant    Review of Systems  Constitutional: No fever/chills Eyes: No visual changes. ENT: Positive for left ear pain.  No sore throat. Cardiovascular: Denies chest pain. Respiratory: Denies shortness of  breath. Gastrointestinal: No abdominal pain.  No nausea, no vomiting.  No diarrhea.  No constipation. Genitourinary: Negative for dysuria. Musculoskeletal: Negative for back pain. Skin: Negative for rash. Neurological: Negative for headaches, focal weakness or numbness.   ____________________________________________   PHYSICAL EXAM:  VITAL SIGNS: ED Triage Vitals [02/01/21 0117]  Enc Vitals Group     BP 114/80     Pulse Rate 100     Resp 20     Temp 98.7 F (37.1 C)     Temp Source Oral     SpO2 99 %     Weight      Height      Head Circumference      Peak Flow      Pain Score 6     Pain Loc      Pain Edu?      Excl. in GC?     Constitutional: Alert and oriented. Well appearing and in no acute distress. Eyes: Conjunctivae are normal. PERRL. EOMI. Head: Atraumatic. Ears: Right TM unremarkable.  Left external canal erythematous and swollen with debris.  Left TM unremarkable. Nose: No congestion/rhinnorhea. Mouth/Throat: Mucous membranes are moist.   Neck: No stridor.   Cardiovascular: Normal rate, regular rhythm. Grossly normal heart sounds.  Good peripheral circulation. Respiratory: Normal respiratory effort.  No retractions. Lungs CTAB. Gastrointestinal: Soft and nontender. No distention. No abdominal bruits. No CVA tenderness. Musculoskeletal: No lower extremity tenderness nor edema.  No joint effusions. Neurologic:  Normal speech and language. No gross focal neurologic deficits are appreciated. No gait instability. Skin:  Skin is warm, dry and intact. No rash noted. Psychiatric: Mood and affect are normal. Speech and behavior are normal.  ____________________________________________   LABS (all labs ordered are listed, but only abnormal results are displayed)  Labs Reviewed - No data to display ____________________________________________  EKG  None ____________________________________________  RADIOLOGY I, Atalie Oros J, personally viewed and evaluated  these images (plain radiographs) as part of my medical decision making, as well as reviewing the written report by the radiologist.  ED MD interpretation: None  Official radiology report(s): No results found.  ____________________________________________   PROCEDURES  Procedure(s) performed (including Critical Care):  Procedures   ____________________________________________   INITIAL IMPRESSION / ASSESSMENT AND PLAN / ED COURSE  As part of my medical decision making, I reviewed the following data within the electronic MEDICAL RECORD NUMBER Nursing notes reviewed and incorporated and Notes from prior ED visits     25 year old female presenting with otalgia.  Will treat with Ciprodex for otitis externa.  Lidocaine drops for comfort.  Strict return precautions given.  Patient verbalizes understanding agrees with plan of care.      ____________________________________________   FINAL CLINICAL IMPRESSION(S) / ED DIAGNOSES  Final diagnoses:  Acute otitis externa of left ear, unspecified type  Earache on left     ED Discharge Orders         Ordered    ciprofloxacin-dexamethasone (CIPRODEX) OTIC suspension  2 times daily        02/01/21 0159           Note:  This document was prepared using Dragon voice recognition software and may include unintentional dictation errors.   Irean Hong, MD 02/01/21 0300

## 2021-02-01 NOTE — ED Triage Notes (Signed)
Pt just woke up with a feeling of "fullness" in her ear, intermittent pain and pt reports that her hearing is muffled.

## 2021-06-21 ENCOUNTER — Ambulatory Visit: Payer: Self-pay | Admitting: Dermatology

## 2024-01-13 ENCOUNTER — Ambulatory Visit
Admission: RE | Admit: 2024-01-13 | Discharge: 2024-01-13 | Disposition: A | Discharge status: Home / Self Care | Source: Ambulatory Visit | Attending: Physician Assistant | Admitting: Physician Assistant

## 2024-01-13 VITALS — BP 108/75 | HR 92 | Temp 98.9°F | Resp 15

## 2024-01-13 DIAGNOSIS — J0300 Acute streptococcal tonsillitis, unspecified: Secondary | ICD-10-CM

## 2024-01-13 DIAGNOSIS — R509 Fever, unspecified: Secondary | ICD-10-CM

## 2024-01-13 DIAGNOSIS — R6883 Chills (without fever): Secondary | ICD-10-CM

## 2024-01-13 LAB — GROUP A STREP BY PCR: Group A Strep by PCR: DETECTED — AB

## 2024-01-13 MED ORDER — AMOXICILLIN 500 MG PO CAPS: 500.0000 mg | ORAL_CAPSULE | Freq: Two times a day (BID) | ORAL | 0 refills | Status: AC

## 2024-01-13 NOTE — Discharge Instructions (Addendum)
-   Positive strep test.  I sent antibiotics to pharmacy and you should be feeling better in a couple days. - May take ibuprofen  and Tylenol  for fever and pain over the next couple days. - Increase rest and fluids. - If you have fever that is not breaking in a couple of days, sore throat that is not improving within a couple of days or increased swelling please return or go to ER for reevaluation. - You are most contagious to others until you break the fever.

## 2024-01-13 NOTE — ED Provider Notes (Signed)
 [facility]    CSN: [id number] Arrival date & time: 01/13/24  1041      History   Chief Complaint Chief Complaint  Patient presents with   Sore Throat    Right tonsil is swollen with spots. Can feel pressure in my ear. Have occasional chills. Hurts to swallow. No fever - Entered by patient    HPI Sonya Shannon is a 28 y.o. female presenting for fever up to 101 degrees, fatigue, chills, sore throat, painful swallowing and nasal congestion x 1 day. Denies cough, ear pain, sinus pain, chest pain, SOB, vomiting or diarrhea. Has been taking OTC meds.  HPI  Past Medical History:  Diagnosis Date   No known health problems     Patient Active Problem List   Diagnosis Date Noted   Supervision of other normal pregnancy, antepartum 05/30/2020   Hyperemesis gravidarum 05/27/2020   Epigastric hernia 11/27/2012    Past Surgical History:  Procedure Laterality Date   No past surgery      OB History     Gravida  3   Para  1   Term  1   Preterm      AB  1   Living  1      SAB  1   IAB      Ectopic      Multiple  0   Live Births  1        Obstetric Comments  Age first period 33          Home Medications    Prior to Admission medications   Medication Sig Start Date End Date Taking? Authorizing Provider  amoxicillin  (AMOXIL ) 500 MG capsule Take 1 capsule (500 mg total) by mouth 2 (two) times daily for 10 days. 01/13/24 01/23/24 Yes Floydene Hy, PA-C    Family History Family History  Problem Relation Age of Onset   Breast cancer Neg Hx    Ovarian cancer Neg Hx     Social History Social History   Tobacco Use   Smoking status: Former   Smokeless tobacco: Never  Advertising account planner   Vaping status: Former  Substance Use Topics   Alcohol use: Not Currently   Drug use: Yes    Types: Marijuana    Comment: Stoped when found out pregnant     Allergies   Patient has no known allergies.   Review of Systems Review of Systems   Constitutional:  Positive for chills, fatigue and fever. Negative for diaphoresis.  HENT:  Positive for congestion, rhinorrhea, sore throat and trouble swallowing. Negative for ear pain, sinus pressure and sinus pain.   Respiratory:  Negative for cough and shortness of breath.   Cardiovascular:  Negative for chest pain.  Gastrointestinal:  Negative for abdominal pain, nausea and vomiting.  Musculoskeletal:  Negative for myalgias.  Skin:  Negative for rash.  Neurological:  Negative for weakness and headaches.  Hematological:  Negative for adenopathy.     Physical Exam Triage Vital Signs ED Triage Vitals  Encounter Vitals Group     BP      Systolic BP Percentile      Diastolic BP Percentile      Pulse      Resp      Temp      Temp src      SpO2      Weight      Height      Head Circumference  Peak Flow      Pain Score      Pain Loc      Pain Education      Exclude from Growth Chart    No data found.  Updated Vital Signs BP 108/75 (BP Location: Right Arm)   Pulse 92   Temp 98.9 F (37.2 C) (Oral)   Resp 15   SpO2 98%    Physical Exam Vitals and nursing note reviewed.  Constitutional:      General: She is not in acute distress.    Appearance: Normal appearance. She is well-developed. She is not ill-appearing or toxic-appearing.  HENT:     Head: Normocephalic and atraumatic.     Nose: Nose normal.     Mouth/Throat:     Mouth: Mucous membranes are moist.     Pharynx: Oropharynx is clear. Posterior oropharyngeal erythema present.     Tonsils: Tonsillar exudate present. 2+ on the right. 2+ on the left.  Eyes:     General: No scleral icterus.       Right eye: No discharge.        Left eye: No discharge.     Conjunctiva/sclera: Conjunctivae normal.  Cardiovascular:     Rate and Rhythm: Normal rate and regular rhythm.     Heart sounds: Normal heart sounds.  Pulmonary:     Effort: Pulmonary effort is normal. No respiratory distress.     Breath sounds:  Normal breath sounds.  Musculoskeletal:     Cervical back: Neck supple.  Skin:    General: Skin is dry.  Neurological:     General: No focal deficit present.     Mental Status: She is alert. Mental status is at baseline.     Motor: No weakness.     Gait: Gait normal.  Psychiatric:        Mood and Affect: Mood normal.        Behavior: Behavior normal.      UC Treatments / Results  Labs (all labs ordered are listed, but only abnormal results are displayed) Labs Reviewed  GROUP A STREP BY PCR - Abnormal; Notable for the following components:      Result Value   Group A Strep by PCR DETECTED (*)    All other components within normal limits    EKG   Radiology No results found.  Procedures Procedures (including critical care time)  Medications Ordered in UC Medications - No data to display  Initial Impression / Assessment and Plan / UC Course  I have reviewed the triage vital signs and the nursing notes.  Pertinent labs & imaging results that were available during my care of the patient were reviewed by me and considered in my medical decision making (see chart for details).   28 y/o female presents for fever, chills, fatigue and sore throat x 1 day. Mild congestion began today. Fever up to 101 degrees yesterday but no fever today and has to taken any antipyretics today. Denies cough, chest pain or SOB.  Vitals are stable and normal. Overall well appearing. NAD. On exam has erythema of posterior pharynx, 2+ enlarged tonsils with grayish white exudates. Chest is clear.   PCR strep test obtained. Positive.   Sent amoxicillin  to pharmacy. Advised OTC meds and supportive care. Reviewed return and ED precautions.   Acute illness with systemic symptoms.   Final Clinical Impressions(s) / UC Diagnoses   Final diagnoses:  Strep tonsillitis  Fever, unspecified  Chills  Discharge Instructions      - Positive strep test.  I sent antibiotics to pharmacy and you  should be feeling better in a couple days. - May take ibuprofen  and Tylenol  for fever and pain over the next couple days. - Increase rest and fluids. - If you have fever that is not breaking in a couple of days, sore throat that is not improving within a couple of days or increased swelling please return or go to ER for reevaluation. - You are most contagious to others until you break the fever.   ED Prescriptions     Medication Sig Dispense Auth. Provider   amoxicillin  (AMOXIL ) 500 MG capsule Take 1 capsule (500 mg total) by mouth 2 (two) times daily for 10 days. 20 capsule Floydene Hy, PA-C      PDMP not reviewed this encounter.   Floydene Hy, PA-C 01/13/24 1123

## 2024-01-13 NOTE — ED Triage Notes (Signed)
 Right tonsil is swollen with spots. Can feel pressure in my ear. Have occasional chills. Hurts to swallow. Fever last night. Sx x 1 day
